# Patient Record
Sex: Male | Born: 1965 | Race: White | Hispanic: No | Marital: Single | Smoking: Never smoker
Health system: Southern US, Community
[De-identification: ages and names within clinical notes are randomized; demographics above are authoritative.]

## PROBLEM LIST (undated history)

## (undated) DIAGNOSIS — Z9889 Other specified postprocedural states: Secondary | ICD-10-CM

## (undated) DIAGNOSIS — I10 Essential (primary) hypertension: Secondary | ICD-10-CM

## (undated) DIAGNOSIS — T4145XA Adverse effect of unspecified anesthetic, initial encounter: Secondary | ICD-10-CM

## (undated) DIAGNOSIS — K649 Unspecified hemorrhoids: Secondary | ICD-10-CM

## (undated) DIAGNOSIS — Z87442 Personal history of urinary calculi: Secondary | ICD-10-CM

## (undated) DIAGNOSIS — Z8489 Family history of other specified conditions: Secondary | ICD-10-CM

## (undated) DIAGNOSIS — R112 Nausea with vomiting, unspecified: Secondary | ICD-10-CM

## (undated) HISTORY — PX: WISDOM TOOTH EXTRACTION: SHX21

## (undated) HISTORY — DX: Essential (primary) hypertension: I10

## (undated) HISTORY — PX: JOINT REPLACEMENT: SHX530

## (undated) HISTORY — PX: COLONOSCOPY: SHX174

---

## 1999-07-01 ENCOUNTER — Encounter: Payer: Self-pay | Admitting: Urology

## 1999-07-01 ENCOUNTER — Ambulatory Visit (HOSPITAL_COMMUNITY): Admission: RE | Admit: 1999-07-01 | Discharge: 1999-07-01 | Payer: Self-pay | Admitting: Urology

## 2000-09-22 HISTORY — PX: LITHOTRIPSY: SUR834

## 2001-04-08 ENCOUNTER — Emergency Department (HOSPITAL_COMMUNITY): Admission: EM | Admit: 2001-04-08 | Discharge: 2001-04-08 | Payer: Self-pay | Admitting: Emergency Medicine

## 2001-09-22 HISTORY — PX: OTHER SURGICAL HISTORY: SHX169

## 2001-12-14 ENCOUNTER — Encounter: Payer: Self-pay | Admitting: Internal Medicine

## 2001-12-14 ENCOUNTER — Ambulatory Visit (HOSPITAL_COMMUNITY): Admission: RE | Admit: 2001-12-14 | Discharge: 2001-12-14 | Payer: Self-pay | Admitting: Internal Medicine

## 2001-12-31 ENCOUNTER — Encounter: Payer: Self-pay | Admitting: Internal Medicine

## 2001-12-31 ENCOUNTER — Encounter: Admission: RE | Admit: 2001-12-31 | Discharge: 2001-12-31 | Payer: Self-pay | Admitting: Internal Medicine

## 2004-05-06 ENCOUNTER — Ambulatory Visit (HOSPITAL_COMMUNITY): Admission: RE | Admit: 2004-05-06 | Discharge: 2004-05-06 | Payer: Self-pay | Admitting: Gastroenterology

## 2005-09-22 HISTORY — PX: OTHER SURGICAL HISTORY: SHX169

## 2006-02-05 ENCOUNTER — Encounter (INDEPENDENT_AMBULATORY_CARE_PROVIDER_SITE_OTHER): Payer: Self-pay | Admitting: Specialist

## 2006-02-05 ENCOUNTER — Ambulatory Visit (HOSPITAL_BASED_OUTPATIENT_CLINIC_OR_DEPARTMENT_OTHER): Admission: RE | Admit: 2006-02-05 | Discharge: 2006-02-05 | Payer: Self-pay | Admitting: General Surgery

## 2006-09-22 DIAGNOSIS — T8859XA Other complications of anesthesia, initial encounter: Secondary | ICD-10-CM

## 2006-09-22 HISTORY — DX: Other complications of anesthesia, initial encounter: T88.59XA

## 2007-02-10 ENCOUNTER — Inpatient Hospital Stay (HOSPITAL_COMMUNITY): Admission: RE | Admit: 2007-02-10 | Discharge: 2007-02-12 | Payer: Self-pay | Admitting: Orthopedic Surgery

## 2007-04-14 ENCOUNTER — Encounter: Admission: RE | Admit: 2007-04-14 | Discharge: 2007-05-20 | Payer: Self-pay | Admitting: Orthopedic Surgery

## 2007-07-21 ENCOUNTER — Encounter: Admission: RE | Admit: 2007-07-21 | Discharge: 2007-10-19 | Payer: Self-pay | Admitting: Orthopedic Surgery

## 2007-10-11 ENCOUNTER — Inpatient Hospital Stay (HOSPITAL_COMMUNITY): Admission: RE | Admit: 2007-10-11 | Discharge: 2007-10-13 | Payer: Self-pay | Admitting: Orthopedic Surgery

## 2007-12-02 ENCOUNTER — Encounter: Admission: RE | Admit: 2007-12-02 | Discharge: 2008-03-01 | Payer: Self-pay | Admitting: Orthopedic Surgery

## 2008-01-12 ENCOUNTER — Ambulatory Visit (HOSPITAL_COMMUNITY): Admission: RE | Admit: 2008-01-12 | Discharge: 2008-01-12 | Payer: Self-pay | Admitting: Orthopedic Surgery

## 2008-05-04 ENCOUNTER — Emergency Department (HOSPITAL_COMMUNITY): Admission: EM | Admit: 2008-05-04 | Discharge: 2008-05-04 | Payer: Self-pay | Admitting: Emergency Medicine

## 2009-06-13 ENCOUNTER — Ambulatory Visit (HOSPITAL_COMMUNITY): Admission: RE | Admit: 2009-06-13 | Discharge: 2009-06-13 | Payer: Self-pay | Admitting: Orthopedic Surgery

## 2010-12-04 ENCOUNTER — Other Ambulatory Visit (HOSPITAL_COMMUNITY): Payer: Self-pay | Admitting: Orthopedic Surgery

## 2010-12-04 DIAGNOSIS — T84031A Mechanical loosening of internal left hip prosthetic joint, initial encounter: Secondary | ICD-10-CM

## 2010-12-06 ENCOUNTER — Inpatient Hospital Stay (HOSPITAL_COMMUNITY): Admission: RE | Admit: 2010-12-06 | Payer: Self-pay | Source: Ambulatory Visit

## 2010-12-17 ENCOUNTER — Other Ambulatory Visit: Payer: Self-pay | Admitting: Orthopedic Surgery

## 2010-12-17 DIAGNOSIS — M25552 Pain in left hip: Secondary | ICD-10-CM

## 2010-12-18 ENCOUNTER — Ambulatory Visit
Admission: RE | Admit: 2010-12-18 | Discharge: 2010-12-18 | Disposition: A | Discharge status: Home / Self Care | Payer: PRIVATE HEALTH INSURANCE | Source: Ambulatory Visit | Attending: Orthopedic Surgery | Admitting: Orthopedic Surgery

## 2010-12-18 DIAGNOSIS — M25552 Pain in left hip: Secondary | ICD-10-CM

## 2011-02-04 NOTE — Op Note (Signed)
Patrick Salas, BAHL NO.:  [id number]   MEDICAL RECORD NO.:  [id number]          PATIENT TYPE:  INP   LOCATION:  2899                         FACILITY:  [facility]   PHYSICIAN:  Feliberto Gottron. Turner Daniels, M.D.   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   PROCEDURE:  Complex right total hip arthroplasty using DePuy 52 mm ASR  cup, 20 x 15 x 165 x 42 S-ROM stem, 20B small cone, NK+0 46 mm Ultimate  head.   SURGEON:  Feliberto Gottron. Turner Daniels, M.D.   FIRST ASSISTANT:  Skip Mayer, [facility].   ANESTHETIC:  General endotracheal.   ESTIMATED BLOOD LOSS:  500 mL.   FLUID REPLACEMENT:  1500 mL crystalloid.   DRAINS PLACED:  Foley catheter.   URINE OUTPUT:  300 mL.   INDICATIONS FOR PROCEDURE:  A 45 year old man who developed avascular  necrosis of both hips and underwent free fibular grafting at [facility] about 5 years ago.  Both hips have gone on to collapse  despite the free fibular grafting.  He underwent a left total hip  arthroplasty by me in 2008, has done very well and desires same for the  right hip.  Risks and benefits of surgery are well-known to the patient,  questions answered.   DESCRIPTION OF PROCEDURE:  The patient identified by armband, taken to  the operating room at [facility].  Appropriate anesthetic  monitors were attached, general endotracheal anesthesia induced, and a  Foley catheter was inserted.  He was then rolled into the left lateral  decubitus position, fixed there with a Stulberg Mark II pelvic clamp,  and the right lower  extremity prepped and draped in the usual sterile  fashion from the ankle to the hemipelvis.  Skin along the lateral hip  and thigh infiltrated with 20 mL of 0.5% Marcaine and epinephrine  solution, and we began the procedure by making a posterolateral approach  to the hip centered over the greater trochanter, and the incision was  about 15 cm in length.  Small bleeders in the skin and subcutaneous  tissue were identified and cauterized.  The IT band was cut in line with  the skin incision, exposing the greater trochanter, which had a fair  amount of scar tissue sucked down to it from the old free fibular  grafting.  This was peeled posteriorly, exposing the posterior aspect of  the hip joint.  A Cobra retractor was placed between the gluteus minimus  and the superior hip joint capsule and a second Cobra between the  inferior hip joint capsule and the quadratus femoris.  This isolated the  piriformis and short external rotators which were then tagged  with a #2  Ethibond suture and cut off their insertion on the intertrochanteric  crest.  This exposed the hip joint capsule which was then developed into  an acetabular based flap and likewise tagged with two #2 Ethibond  sutures.  The femoral head was now well exposed and with internal  rotation, flexion and traction with abduction the head was dislocated  and a standard neck cut was performed 1 fingerbreadth above the lesser  trochanter.  The neck cut was a little bit difficult secondary to the  fibula that was existing inside of the neck of the femur, but this was  accomplished.  The proximal femur was then translated anteriorly,  levering off the anterior column with a Homan retractor, allowing [place] to  place posterior-superior and posterior-inferior para-acetabular wing  retractors.  the labrum was then excised with the electrocautery and we  sequentially reamed up to a 51 mm basket reamer, obtaining good coverage  in all quadrants, and  then lightly touched the edge with a 52 mm basket  reamer in preparation for the 52 mm ASR cup.  This was then hammered  into place in 45 degrees of abduction and 15 degrees of anteversion,  obtaining good fit and fill.  We then directed our attention to the  proximal femur which was flexed and internally rotated, and entered the  proximal femur with the very sharp step drill from DePuy, going right  through the free fibular graft, in the process obtaining access to the  femoral canal.  We then sounded the canal with the 8 mm reamer, making  sure to stay within the canal and not get deviated by the free fibular  graft which actually came out in pieces at this point.  We then  sequentially reamed up to 15 mm reamer, obtaining good chatter, reamed  three-fourths of the way down with a 15.5, and then conically reamed up  to 20B cone to the appropriate depth for a 42 mm neck.  The calcar was  then milled to a small calcar and a trial 20B small cone hammered into  place followed by a trial stem with an extra 10 degrees of anteversion  dialed into the stem with an NK+0 46 mm trial.  The hip was reduced,  came to full extension, and the knee could be flexed to 110 degrees  without any resistance.  It could flex to 120 degrees with no  dislocation and it took almost 80 degrees of internal rotation to become  unstable.  At this point the trial components were removed.  A 20B small  ZTT-1 cone was hammered into place followed by one more reamed with a  15.5 reamer, followed by the 20 x 15 x 165 x 42 stem in an extra 10  degrees of anteversion until it was seated, and then an NK+0 46 mm  Ultimate ball hammered onto the stem and the hip once again reduced.  Stability was excellent.  The wound was irrigated out with normal saline  solution.  The capsular flap and short external rotators were repaired  back to the intertrochanteric crest through drill holes using the #2  Ethibond suture.  At this point  the IT band was then closed with a  running #1 Vicryl suture, the subcutaneous tissue with 0 and 2-0 undyed  Vicryl suture, and the skin with running interlocking 3-0 nylon suture.  A dressing of Xeroform and Mepilex was then applied.  The patient was  unclamped, rolled supine,  awakened and taken to the recovery room  without difficulty.      Feliberto Gottron. Turner Daniels, M.D.  Electronically Signed     FJR/MEDQ  D:  10/11/2007  T:  10/11/2007  Job:  [id number]

## 2011-02-04 NOTE — Op Note (Signed)
NAMEMarland Kitchen  JAYKOB, MINICHIELLO NO.:  [id number]   MEDICAL RECORD NO.:  [id number]          PATIENT TYPE:  INP   LOCATION:  5036                         FACILITY:  [facility]   PHYSICIAN:  Feliberto Gottron. Turner Daniels, M.D.   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   PROCEDURE:  Left total hip arthroplasty through a previous surgery using  a DePuy 52-mm ASR cup, NK plus zero, 46 mm ultimate head, 20 x 15 x 42 x  175 stem, 20-B small cone.   FIRST ASSISTANT:  Erskine Squibb B. Jannet Mantis. first assist throughout the  case.   SURGEON:  Feliberto Gottron. Turner Daniels, M.D.   ANESTHESIA:  General endotracheal.   ESTIMATED BLOOD LOSS:  400 mL   FLUID REPLACEMENT:  1500 mL of crystalloid.   DRAINS PLACED:  Foley catheter.   TOURNIQUET TIME:  None.   INDICATIONS FOR PROCEDURE:  A 45 year old gentleman who has AVN of the  left hip and had a free fibular strut graft placed some years ago, I  believe, at [facility]. He has had collapse around  the strut graft; has severe pain in his left hip; and desires elective  left total hip arthroplasty.  He was seen by another physician here in  [place] who referred him on for consultation because of the fibular  strut placed up into the femoral neck which may interfere with a  standard total hip.  The plan is to do an ASR cup and an S-ROM which  will require no broaches, only milling.  Risks and benefits of surgery  were discussed, questions answered.   DESCRIPTION OF PROCEDURE:  The patient identified by armband, taken the  operating room at [facility].  Appropriate anesthetic monitors  were attached and general endotracheal anesthesia induced with the  patient in the supine position.  He was then rolled into the right  lateral decubitus position; fixed there with a silver Mark II pelvic  clamp; and the left lower extremity prepped and draped in the usual  sterile fashion from the ankle to the hemi pelvis.  The skin along the  lateral hip and thigh was infiltrated with 20 mL of 1/2% Marcaine and  epinephrine solution; and 18-cm incision allowing posterolateral  approach to the left hip was made through the skin and subcutaneous  tissue centered over the greater trochanter.   Small bleeders in the skin and subcutaneous tissue identified and  cauterized with electrocautery.  IT band was cut in line with the skin  incision exposing the greater trochanter.  Cobra retractors were placed  between the gluteus minimus and the superior joint capsule and between  the quadratus femoris; and the anterior hip  joint capsule.  At this  point the piriformis and short external rotators were tagged with a #2  Ethibond suture and cut off their insertion on the intertrochanteric  crest exposing the posterior aspect of the hip joint capsule, itself,  which was developed into an acetabular based flap and likewise tagged  with two #2 Ethibond sutures.  This exposed the femoral head which was  collapsed.  The hip was flexed, and internally rotated dislocating the  femoral head.  One fingerbreadth above the lesser trochanter, a standard  neck cut was performed which was made somewhat difficult by the free  fibular graft which was easily seen going up the femoral neck.  After  the head was extracted, it was examined and did have full thickness  collapse superiorly.   At this point the hip was placed in neutral rotation; the proximal femur  was translocated anteriorly using a Homan retractor to lever off the  anterior column  and posterior-superior and posterior-inferior wing  retractors were placed.  At this point a standard labrectomy was  performed removing the labrum from throughout the acetabulum which was  then sequentially reamed up to a 51 mm basket reamer obtaining good  coverage in all quadrants.  We just got into the subchondral bleeding  bone.   A 52 ASR cup was then selected and hammered into place and then 45  degrees of abduction and about 15 degrees of anteversion.  At this point  the hip was, once again,  flexed and internally rotated after removing  the wing retractors; and we were able to use rongeurs and actually  remove most of the fibular strut graft without too much difficulty.  Once that was accomplished, we entered the proximal femur with the step  drill from the DePuy S-ROM set; and then we actually reamed up to a 15.5-  mm axial reamer.  We milled up to a 20-B cone; and then milled the  calcar to a small calcar piece.  A trial 20-B small cone was then  hammered into place followed by a trial stem with a 42 base neck and the  same version as the calcar with a plus zero, 46 ultimate head.  The hip  was reduced and found to be stable with 90 of flexion and 70 of internal  rotation; and could not be dislocated in extension with external  rotation.   At this point the trial components removed a 20-B small ZTT-1 cone was  hammered into place, followed by a 20 x 15 x 42 175 stem in the same  version as the calcar.  Once this had been accomplished, an NK plus 0,  46 mm, ultimate head was hammered onto the stem; the hip reduced, and  the stability found to be excellent.  The wound was once again irrigated  out with normal saline solution; and then the short external rotators.  Capsule and piriformis were repaired back to the intertrochanteric crest  through drill holes using the #2 Ethibond suture.  The wound was irrigated one more time.  The IT band closed with a running #1 Vicryl   suture.  The subcutaneous tissue with #0 and #2 undyed Vicryl suture,  and the skin with running interlocking 3-0 nylon suture.  A dressing of  Xeroform, 4 x 4 dressing. Sponges, and Hypafix tape was then applied.  The patient was unclamped, rolled supine, awakened, and taken to the  recovery room without difficulty.      Feliberto Gottron.  Turner Daniels, M.D.  Electronically Signed     FJR/MEDQ  D:  03/01/2007  T:  03/02/2007  Job:  [id number]

## 2011-02-07 NOTE — Discharge Summary (Signed)
Patrick Salas, REASONER NO.:  [id number]   MEDICAL RECORD NO.:  [id number]          PATIENT TYPE:  INP   LOCATION:  5011                         FACILITY:  [facility]   PHYSICIAN:  Feliberto Gottron. Turner Daniels, M.D.   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   HISTORY:  The patient is a 45 year old man who developed a vascular  necrosis of the bilateral hips and underwent free fibular grafting at  [facility] approximately five years prior to this admission.  Both  hips have gone on to collapse, despite the free fibular grafting.  The  patient underwent a left total hip arthroplasty by Dr. Feliberto Gottron. Turner Daniels in  2008, and has done very well and desired the same for the right hip.  The risks and benefits of surgery were well known to the patient and his  questions were answered preoperatively.  He elected to proceed.   ALLERGIES:  STEROIDS.   MEDICATIONS ON ADMISSION:  1. Tylenol.  2. Lisinopril.   PAST MEDICAL HISTORY:  Hypertension.   PAST SURGICAL HISTORY:  1. For hemorrhoids.  2. Hip arthroscopy.  3. Free fibular graft in 2003.  4. Left total hip in 2008.  No difficulties with GT.   FAMILY HISTORY:  Mother is alive at age 93, with positive hypertension.  Father's history is not given.   SOCIAL HISTORY:  No tobacco, no ethanol, no drug use.  He is married.  He has a supportive wife at home.   REVIEW OF SYSTEMS:  The patient denies any shortness of breath, chest  pain or recent illness.   PHYSICAL EXAMINATION:  VITAL SIGNS:  Temperature 97.3 degrees, pulse 93,  blood pressure 139/96.  He is 5 feet 7 inches tall, weighs 180 pounds.  HEENT:  Normocephalic and atraumatic.  Ears:  Tympanic membranes clear.  Eyes:  Pupils equal, round, reactive to light and  accommodation.  Nose  is patent and benign.  NECK:  Supple with a fair range of motion.  CHEST:  Clear to auscultation and percussion.  HEART:  A regular rate and rhythm.  ABDOMEN:  Soft, nontender.  No masses.  Bowel sounds 2+.  EXTREMITIES:  Right hip range of motion:  Internal and external rotation  5 degrees and 10 degrees respectively.  Positive limp.  A well-healed  normal scar on the opposite hip.   LABORATORY DATA:  X-rays show AVN changes with positive collapse of the  femoral head.   Preoperative labs including a CBC, CMET, chest x-ray, electrocardiogram,  PT and PTT were all within normal limits with the exception of an ALT of  126.   HOSPITAL COURSE:  On the day of admission the patient underwent a total  hip arthroplasty using Dupuy 52 mm ASR cup and a 20 x 15 x 165 x 42 SROM  stem, 20-B small cone and NK +046 Ultima head.  A Foley catheter was  placed preoperatively.  The patient was placed on perioperative  antibiotics.  The patient was placed on postoperative Coumadin  prophylaxis per the pharmacy protocol and bridging Lovenox therapy,  until it became therapeutic.  He was placed on a PCA Dilaudid pump for  pain control and physical therapy was begun on the first postoperative  day.   On postoperative day one the patient was awake and alert using PCA well,  but complaining of severe pain at an 8/10.  His vital signs were stable.  The wound was benign.  He was neurovascularly intact.  X-rays showed a  well-placed prosthesis.  He was otherwise stable.  He continued with his  physical therapy.   On postoperative day two the patient was awake and alert.  His pain had  greatly diminished.  He had walked in the hallway but no stairs had yet  been attempted.  His vital signs were stable.  He was afebrile.  The  wound was clean and dry.  He was neurovascularly intact.  Hemoglobin  9.6, WBC 7.3.  Otherwise stable.  He was both medically improved and  orthopedically  improved as well.  He was discharged home after meeting  the physical therapy goals that afternoon.   DISCHARGE MEDICATIONS:  1. Lisinopril/hydrochlorothiazide 20/12.5 mg daily.  2. Vicodin p.r.n. pain.  3. Coumadin per pharmacy protocol.   ACTIVITY:  Weightbearing as tolerated with total hip precautions.   WOUND CARE:  Dressing changed daily or as needed.   DIET:  A regular diet.   FOLLOWUP:  The patient should return to the clinic in approximately one  week's time and for follow-up checks, should he have any drainage from  the wound, increase in pain or temperature greater than 101 degrees.  He  should follow up with [facility] for his PT, his wound care and  his Coumadin management per the pharmacy.   DISCHARGE DIAGNOSES:  End-stage degenerative joint disease, secondary to  acute vascular necrosis of the right hip.   PROCEDURE:  Right total hip arthroplasty.      Laural Benes. Jannet Mantis.      Feliberto Gottron. Turner Daniels, M.D.  Electronically Signed    JBR/MEDQ  D:  10/31/2007  T:  11/01/2007  Job:  [id number]

## 2011-06-12 LAB — URINALYSIS, ROUTINE W REFLEX MICROSCOPIC
Glucose, UA: NEGATIVE
Hgb urine dipstick: NEGATIVE
Ketones, ur: NEGATIVE
Protein, ur: NEGATIVE
Urobilinogen, UA: 0.2

## 2011-06-12 LAB — CBC
HCT: 44.7
HCT: 27.9 — ABNORMAL LOW
HCT: 29.1 — ABNORMAL LOW
Hemoglobin: 15.4
MCHC: 34.5
MCHC: 35
MCV: 82
MCV: 83.3
Platelets: 152
Platelets: 157
RBC: 5.46
RDW: 13.6
RDW: 13.9
WBC: 7.2

## 2011-06-12 LAB — DIFFERENTIAL
Basophils Absolute: 0.1
Eosinophils Absolute: 0.1
Eosinophils Relative: 2
Lymphocytes Relative: 27
Lymphs Abs: 1.9
Neutrophils Relative %: 66

## 2011-06-12 LAB — COMPREHENSIVE METABOLIC PANEL
AST: 22
CO2: 28
Calcium: 9.7
Chloride: 99
Creatinine, Ser: 1.33
GFR calc Af Amer: >60
GFR calc non Af Amer: 60 — ABNORMAL LOW
Glucose, Bld: 101 — ABNORMAL HIGH
Total Bilirubin: 1.1

## 2011-06-12 LAB — BASIC METABOLIC PANEL
BUN: 12
BUN: 16
CO2: 28
Calcium: 8.2 — ABNORMAL LOW
Calcium: 8.2 — ABNORMAL LOW
Creatinine, Ser: 1.32
Creatinine, Ser: 1.33
GFR calc non Af Amer: 60 — ABNORMAL LOW
Glucose, Bld: 108 — ABNORMAL HIGH
Glucose, Bld: 114 — ABNORMAL HIGH
Potassium: 3.9

## 2011-06-12 LAB — PROTIME-INR
INR: 0.9
Prothrombin Time: 12.2
Prothrombin Time: 13.6
Prothrombin Time: 21.6 — ABNORMAL HIGH

## 2011-06-12 LAB — TYPE AND SCREEN: ABO/RH(D): O NEG

## 2011-12-10 ENCOUNTER — Other Ambulatory Visit (HOSPITAL_COMMUNITY): Payer: Self-pay | Admitting: Orthopedic Surgery

## 2011-12-10 DIAGNOSIS — M25551 Pain in right hip: Secondary | ICD-10-CM

## 2011-12-12 ENCOUNTER — Ambulatory Visit (HOSPITAL_COMMUNITY)
Admission: RE | Admit: 2011-12-12 | Discharge: 2011-12-12 | Disposition: A | Discharge status: Home / Self Care | Payer: Self-pay | Source: Ambulatory Visit | Attending: Orthopedic Surgery | Admitting: Orthopedic Surgery

## 2011-12-12 DIAGNOSIS — M25551 Pain in right hip: Secondary | ICD-10-CM

## 2012-12-13 ENCOUNTER — Ambulatory Visit (INDEPENDENT_AMBULATORY_CARE_PROVIDER_SITE_OTHER): Payer: Medicare Other | Admitting: General Surgery

## 2012-12-13 ENCOUNTER — Encounter (INDEPENDENT_AMBULATORY_CARE_PROVIDER_SITE_OTHER): Payer: Self-pay | Admitting: General Surgery

## 2012-12-13 ENCOUNTER — Telehealth (INDEPENDENT_AMBULATORY_CARE_PROVIDER_SITE_OTHER): Payer: Self-pay

## 2012-12-13 VITALS — BP 142/98 | HR 100 | Temp 97.7°F | Resp 16 | Ht 67.0 in | Wt 205.8 lb

## 2012-12-13 DIAGNOSIS — K648 Other hemorrhoids: Secondary | ICD-10-CM

## 2012-12-13 DIAGNOSIS — K644 Residual hemorrhoidal skin tags: Secondary | ICD-10-CM | POA: Insufficient documentation

## 2012-12-13 MED ORDER — HYDROCORTISONE 2.5 % RE CREA: TOPICAL_CREAM | Freq: Two times a day (BID) | RECTAL | Status: DC

## 2012-12-13 NOTE — Patient Instructions (Signed)
Take over the counter stool softener twice daily.  Use prescription cream right at outside of anus on extra tissue that you can feel\  Avoid lots of wiping and trauma.  Consider using water squirt bottle and blow dryer to avoid additional trauma.

## 2012-12-13 NOTE — Assessment & Plan Note (Signed)
Patient has internal hemorrhoids and external hemorrhoids. External hemorrhoid does appear to be slightly pedunculated and may not respond to topical therapies. He also has significant excoriations in the perianal region. This is likely contributing to his sensation of itching and burning. I injected the internal hemorrhoid. We will try Anusol HC cream externally. If this is ineffective we will see what other therapies we can try. He may end up having to have the external hemorrhoid excised.  I reviewed good bowel habits with the patient.  I advised him to add over the counter stool softeners and to avoid additional trauma to the region.

## 2012-12-13 NOTE — Progress Notes (Signed)
Chief Complaint  Patient presents with  . New Evaluation    eval hems  . Rectal Problems    HISTORY: Patient is a 47 year old male has had hemorrhoids for around 3 months. Has been complaints are itching and burning. He also describes some bleeding. He was seen by Dr. Earlene Plater in the past for an anal fissure. The symptoms are not the same as that.  He already drinks a fair amount of water. He eats a lot of vegetables. He does not somewhat toilet for a long time. He also does not complain of straining to have bowel movements. He has bowel movements daily. He has tried over-the-counter suppositories from [employer].  Past Medical History  Diagnosis Date  . Hypertension     Past Surgical History  Procedure Laterality Date  . Joint replacement Bilateral 2008/2009  . Vascular fibular graft  2003    Current Outpatient Prescriptions  Medication Sig Dispense Refill  . amLODipine (NORVASC) 10 MG tablet       . losartan (COZAAR) 100 MG tablet       . hydrocortisone (ANUSOL-HC) 2.5 % rectal cream Place rectally 2 (two) times daily.  30 g  3   No current facility-administered medications for this visit.     Allergies  Allergen Reactions  . Other     DON NOT GIVE PATIENT STEROIDS     History reviewed. No pertinent family history.   History   Social History  . Marital Status: Single    Spouse Name: N/A    Number of Children: N/A  . Years of Education: N/A   Social History Main Topics  . Smoking status: Never Smoker   . Smokeless tobacco: None  . Alcohol Use: No  . Drug Use: No  . Sexually Active: None   Other Topics Concern  . None   Social History Narrative  . None     REVIEW OF SYSTEMS - PERTINENT POSITIVES ONLY: 12 point review of systems negative other than HPI and PMH EXAM: Filed Vitals:   12/13/12 1013  BP: 142/98  Pulse: 100  Temp: 97.7 F (36.5 C)  Resp: 16    Gen:  No acute distress.  Well nourished and well groomed.   Neurological: Alert and  oriented to person, place, and time. Coordination normal.  Head: Normocephalic and atraumatic.  Eyes: Conjunctivae are normal. Pupils are equal, round, and reactive to light. No scleral icterus.  Cardiovascular: Normal rate, regular rhythm Respiratory: Effort normal.  No respiratory distress.  GI: Soft. Musculoskeletal: Normal range of motion. Extremities are nontender. normal gait.   Rectal:  External exam reveals moderate pedunculated hemorrhoid on posterior left aspect.  There are numerous areas of excoriation around the anus and along the perineum. Anoscopy reveals a large posterior inflamed internal hemorrhoid.  This is injected.   Skin: Skin is warm and dry. No rash noted. No diaphoresis. No erythema. No pallor. No clubbing, cyanosis, or edema.   Psychiatric: Normal mood and affect. Behavior is normal. Judgment and thought content normal.     ASSESSMENT AND PLAN: Internal and external bleeding hemorrhoids Patient has internal hemorrhoids and external hemorrhoids. External hemorrhoid does appear to be slightly pedunculated and may not respond to topical therapies. He also has significant excoriations in the perianal region. This is likely contributing to his sensation of itching and burning. I injected the internal hemorrhoid. We will try Anusol HC cream externally. If this is ineffective we will see what other therapies we can try. He may end  up having to have the external hemorrhoid excised.  I reviewed good bowel habits with the patient.  I advised him to add over the counter stool softeners and to avoid additional trauma to the region.       Maudry Diego MD Surgical Oncology, General and Endocrine Surgery [facility]      Visit Diagnoses: 1. Internal and external bleeding hemorrhoids     Primary Care Physician: Lillia Mountain, MD

## 2012-12-13 NOTE — Telephone Encounter (Signed)
Rx for generic Anusol HC w/ 3 refills called to [facility] [address]  Pt is aware.  Pt cannot take oral steroids so is somewhat concerned about the concentration of steroids in this cream.  The pharmacist and Dr. Donell Beers both reassured him that the amount is so small and the medicine is topical not oral, so there is no risk to him.

## 2012-12-20 ENCOUNTER — Ambulatory Visit (INDEPENDENT_AMBULATORY_CARE_PROVIDER_SITE_OTHER): Payer: Self-pay | Admitting: Surgery

## 2013-01-25 ENCOUNTER — Ambulatory Visit (INDEPENDENT_AMBULATORY_CARE_PROVIDER_SITE_OTHER): Payer: Medicare Other | Admitting: General Surgery

## 2013-01-25 ENCOUNTER — Encounter (INDEPENDENT_AMBULATORY_CARE_PROVIDER_SITE_OTHER): Payer: Self-pay | Admitting: General Surgery

## 2013-01-25 VITALS — BP 136/86 | HR 88 | Temp 97.7°F | Ht 67.0 in | Wt 205.8 lb

## 2013-01-25 DIAGNOSIS — K648 Other hemorrhoids: Secondary | ICD-10-CM

## 2013-01-25 DIAGNOSIS — K644 Residual hemorrhoidal skin tags: Secondary | ICD-10-CM

## 2013-01-25 NOTE — Progress Notes (Signed)
HISTORY: Patient's 47 year old male muscle proximally 6 weeks ago for hemorrhoids. He is doing around 25-30% better. He has still a little bit of bleeding when he wipes. The burning and itching has improved significantly.  He continues to feel something swollen around his bottom. He has been using stool softeners and has had less constipation than before.   PERTINENT REVIEW OF SYSTEMS: Otherwise negative.    Filed Vitals:   01/25/13 1156  BP: 136/86  Pulse: 88  Temp: 97.7 F (36.5 C)    EXAM: Head: Normocephalic and atraumatic.  Eyes:  Conjunctivae are normal. Pupils are equal, round, and reactive to light. No scleral icterus.  Resp: No respiratory distress, normal effort. Neurological: Alert and oriented to person, place, and time. Coordination normal.  Skin: Skin is warm and dry. No rash noted. No diaphoretic. No erythema. No pallor.  Rectal.  Continues to have irritation of perianal skin.  Some areas are cracked.  External hemorrhoid posteriorly less inflamed, but still pedunculated.   Psychiatric: Normal mood and affect. Normal behavior. Judgment and thought content normal.      ASSESSMENT AND PLAN:   Internal and external bleeding hemorrhoids Patient continues to have excoriations around his anus. I would not want to surgically treat the hemorrhoid until these have been improved and/or resolved.  I advised him to apply Desitin to the perianal skin.  I will see him back in approximately 6 weeks. At that time if he is still having issues, we will schedule an in office excision of the pedunculated posterior external hemorrhoid.      Maudry Diego, MD Surgical Oncology, General & Endocrine Surgery [facility]  Lillia Mountain, MD Lillia Mountain, MD

## 2013-01-25 NOTE — Patient Instructions (Signed)
Apply desitin to anal region 1-3 times/day.    Follow up in 6-8 weeks.

## 2013-01-25 NOTE — Assessment & Plan Note (Signed)
Patient continues to have excoriations around his anus. I would not want to surgically treat the hemorrhoid until these have been improved and/or resolved.  I advised him to apply Desitin to the perianal skin.  I will see him back in approximately 6 weeks. At that time if he is still having issues, we will schedule an in office excision of the pedunculated posterior external hemorrhoid.

## 2013-03-01 ENCOUNTER — Encounter (INDEPENDENT_AMBULATORY_CARE_PROVIDER_SITE_OTHER): Payer: Self-pay | Admitting: General Surgery

## 2013-03-01 ENCOUNTER — Ambulatory Visit (INDEPENDENT_AMBULATORY_CARE_PROVIDER_SITE_OTHER): Payer: Medicare Other | Admitting: General Surgery

## 2013-03-01 VITALS — BP 142/100 | HR 113 | Temp 97.4°F | Resp 16 | Ht 67.0 in | Wt 204.4 lb

## 2013-03-01 DIAGNOSIS — K644 Residual hemorrhoidal skin tags: Secondary | ICD-10-CM

## 2013-03-01 DIAGNOSIS — B372 Candidiasis of skin and nail: Secondary | ICD-10-CM | POA: Insufficient documentation

## 2013-03-01 DIAGNOSIS — K648 Other hemorrhoids: Secondary | ICD-10-CM

## 2013-03-01 MED ORDER — NYSTATIN 100000 UNIT/GM EX CREA: TOPICAL_CREAM | Freq: Two times a day (BID) | CUTANEOUS | Status: DC

## 2013-03-01 NOTE — Assessment & Plan Note (Signed)
Nystatin cream to perianal area.  D/C all other creams.

## 2013-03-01 NOTE — Progress Notes (Signed)
HISTORY: Patient is a 47 year old male who i have been following for hemorrhoids.  He continues to have itching and burning around the anus.  He is only having rare instances of bleeding.  He is not spending a long period of time on the toilet.  He has been using baby wipes to avoid trauma to the perineum.  He has been using desitin on the perianal skin.     PERTINENT REVIEW OF SYSTEMS: Otherwise negative.    Filed Vitals:   03/01/13 1202  BP: 142/100  Pulse: 113  Temp: 97.4 F (36.3 C)  Resp: 16    EXAM: Head: Normocephalic and atraumatic.  Eyes:  Conjunctivae are normal. Pupils are equal, round, and reactive to light. No scleral icterus.  Resp: No respiratory distress, normal effort. Neurological: Alert and oriented to person, place, and time. Coordination normal.  Skin: Skin is warm and dry. No rash noted. No diaphoretic. No erythema. No pallor.  Rectal.  Has worsening of irritation of perianal skin.  Some areas are cracked.  Now there is bright red color to perianal skin.  External hemorrhoid posteriorly remains less inflamed, but still pedunculated.   Psychiatric: Normal mood and affect. Normal behavior. Judgment and thought content normal.      ASSESSMENT AND PLAN:   Yeast infection of the perianal skin Nystatin cream to perianal area.  D/C all other creams.  Internal and external bleeding hemorrhoids Will plan EUA and hemorrhoidectomy  Pt going to talk to scheduling about financial information.         Maudry Diego, MD Surgical Oncology, General & Endocrine Surgery [facility]  Lillia Mountain, MD Lillia Mountain, MD

## 2013-03-01 NOTE — Patient Instructions (Addendum)
Will plan exam under anesthesia and hemorrhoidectomy.  In the meantime, only use the antifungal cream around anus.   Will have you talk to scheduling regarding financial information.

## 2013-03-01 NOTE — Assessment & Plan Note (Signed)
Will plan EUA and hemorrhoidectomy  Pt going to talk to scheduling about financial information.

## 2013-11-03 ENCOUNTER — Other Ambulatory Visit: Payer: Self-pay | Admitting: Orthopedic Surgery

## 2013-11-08 ENCOUNTER — Encounter (HOSPITAL_COMMUNITY): Payer: Self-pay | Admitting: Pharmacy Technician

## 2013-11-09 ENCOUNTER — Encounter (HOSPITAL_COMMUNITY): Payer: Self-pay

## 2013-11-09 ENCOUNTER — Encounter (HOSPITAL_COMMUNITY)
Admission: RE | Admit: 2013-11-09 | Discharge: 2013-11-09 | Disposition: A | Discharge status: Home / Self Care | Payer: Medicare Other | Source: Ambulatory Visit | Attending: Orthopedic Surgery | Admitting: Orthopedic Surgery

## 2013-11-09 ENCOUNTER — Encounter (HOSPITAL_COMMUNITY)
Admission: RE | Admit: 2013-11-09 | Discharge: 2013-11-09 | Disposition: A | Discharge status: Home / Self Care | Payer: Medicare Other | Source: Ambulatory Visit | Attending: Anesthesiology | Admitting: Anesthesiology

## 2013-11-09 DIAGNOSIS — Z01810 Encounter for preprocedural cardiovascular examination: Secondary | ICD-10-CM | POA: Insufficient documentation

## 2013-11-09 DIAGNOSIS — Z01812 Encounter for preprocedural laboratory examination: Secondary | ICD-10-CM | POA: Insufficient documentation

## 2013-11-09 DIAGNOSIS — Z01818 Encounter for other preprocedural examination: Secondary | ICD-10-CM | POA: Insufficient documentation

## 2013-11-09 HISTORY — DX: Other specified postprocedural states: Z98.890

## 2013-11-09 HISTORY — DX: Personal history of urinary calculi: Z87.442

## 2013-11-09 HISTORY — DX: Adverse effect of unspecified anesthetic, initial encounter: T41.45XA

## 2013-11-09 HISTORY — DX: Nausea with vomiting, unspecified: R11.2

## 2013-11-09 HISTORY — DX: Unspecified hemorrhoids: K64.9

## 2013-11-09 HISTORY — DX: Family history of other specified conditions: Z84.89

## 2013-11-09 LAB — TYPE AND SCREEN
ABO/RH(D): O NEG
Antibody Screen: NEGATIVE

## 2013-11-09 LAB — CBC WITH DIFFERENTIAL/PLATELET
BASOS ABS: 0 10*3/uL (ref 0.0–0.1)
Basophils Relative: 0 % (ref 0–1)
Eosinophils Absolute: 0.3 10*3/uL (ref 0.0–0.7)
Eosinophils Relative: 2 % (ref 0–5)
HCT: 41.1 % (ref 39.0–52.0)
Hemoglobin: 14.4 g/dL (ref 13.0–17.0)
Lymphocytes Relative: 26 % (ref 12–46)
Lymphs Abs: 2.7 10*3/uL (ref 0.7–4.0)
MCH: 28.9 pg (ref 26.0–34.0)
MCHC: 35 g/dL (ref 30.0–36.0)
MCV: 82.5 fL (ref 78.0–100.0)
Monocytes Absolute: 0.5 10*3/uL (ref 0.1–1.0)
Monocytes Relative: 5 % (ref 3–12)
Neutro Abs: 7.1 10*3/uL (ref 1.7–7.7)
Neutrophils Relative %: 67 % (ref 43–77)
PLATELETS: 241 10*3/uL (ref 150–400)
RBC: 4.98 MIL/uL (ref 4.22–5.81)
RDW: 13.3 % (ref 11.5–15.5)
WBC: 10.6 10*3/uL — AB (ref 4.0–10.5)

## 2013-11-09 LAB — SURGICAL PCR SCREEN
MRSA, PCR: NEGATIVE
STAPHYLOCOCCUS AUREUS: POSITIVE — AB

## 2013-11-09 LAB — BASIC METABOLIC PANEL
BUN: 18 mg/dL (ref 6–23)
CALCIUM: 9.9 mg/dL (ref 8.4–10.5)
CO2: 21 meq/L (ref 19–32)
CREATININE: 1.48 mg/dL — AB (ref 0.50–1.35)
Chloride: 99 mEq/L (ref 96–112)
GFR calc non Af Amer: 56 mL/min — ABNORMAL LOW (ref >90)
GFR, EST AFRICAN AMERICAN: 64 mL/min — AB (ref >90)
Glucose, Bld: 98 mg/dL (ref 70–99)
Potassium: 3.7 mEq/L (ref 3.7–5.3)
Sodium: 138 mEq/L (ref 137–147)

## 2013-11-09 LAB — URINALYSIS, ROUTINE W REFLEX MICROSCOPIC
Bilirubin Urine: NEGATIVE
GLUCOSE, UA: NEGATIVE mg/dL
HGB URINE DIPSTICK: NEGATIVE
Ketones, ur: NEGATIVE mg/dL
Leukocytes, UA: NEGATIVE
Nitrite: NEGATIVE
PH: 5 (ref 5.0–8.0)
Protein, ur: NEGATIVE mg/dL
SPECIFIC GRAVITY, URINE: 1.02 (ref 1.005–1.030)
Urobilinogen, UA: 0.2 mg/dL (ref 0.0–1.0)

## 2013-11-09 LAB — PROTIME-INR
INR: 0.93 (ref 0.00–1.49)
PROTHROMBIN TIME: 12.3 s (ref 11.6–15.2)

## 2013-11-09 LAB — APTT: aPTT: 27 seconds (ref 24–37)

## 2013-11-09 NOTE — Pre-Procedure Instructions (Signed)
Patrick Salas  11/09/2013   Your procedure is scheduled on:  Wednesday, February 25.   Report to [facility], Main Entrance / Entrance "A" at 10:45 AM.  Call this number if you have problems the morning of surgery: [phone]   Remember:   Do not eat food or drink liquids after midnight Tuesday, February 24.   Take these medicines the morning of surgery with A SIP OF WATER: amLODipine (NORVASC).     Do not wear jewelry, make-up or nail polish.  Do not wear lotions, powders, or perfumes. You may wear deodorant.  Do not shave 48 hours prior to surgery. Men may shave face and neck.  Do not bring valuables to the hospital.  [facility] is not responsible for any belongings or valuables.               Contacts, dentures or bridgework may not be worn into surgery.  Leave suitcase in the car. After surgery it may be brought to your room.  For patients admitted to the hospital, discharge time is determined by your treatment team.                 Special Instructions:  Review [facility]- Preparing for Surgery   Please read over the following fact sheets that you were given: Pain Booklet, Coughing and Deep Breathing, Blood Transfusion Information and Surgical Site Infection Prevention

## 2013-11-09 NOTE — Progress Notes (Signed)
PCR postive for Staph.  I called [facility] to [facility], [address] , [place], [place].

## 2013-11-09 NOTE — Progress Notes (Signed)
Anesthesia Chart Review:  Patient is a 48 year old male scheduled for left total hip revision on 11/16/13 by Dr. Turner Danielsowan. History includes non-smoker, HTN, nephrolithiasis, family and personal history of post-operative N/V, bilateral THA, hemorrhoids. BMI is 32. History indicates that he "woke up" and saw lights for "maybe 2 seconds" during surgery in 2008. PCP is listed as Dr. Kirby FunkJohn Griffin.  EKG on 11/09/13 showed NSR, non-specific T wave abnormality.  CXR on 11/09/13 showed evidence of prior granulomatous disease (stable calcified granuloma left base).  No edema or consolidation.  Preoperative labs noted.  BUN/Cr 18/1.48 (previous Cr 1.33 in 2009).  Glucose 98.  WBC 10.6, H/H 14.4/41.1. PT/PTT WNL.  UA WNL.  Anticipate patient can proceed as planned.  Velna Ochsllison Zelenak, PA-C [facility]/Anesthesiology Phone [phone] 11/09/2013 3:42 PM

## 2013-11-15 MED ORDER — DEXTROSE-NACL 5-0.45 % IV SOLN: INTRAVENOUS | Status: DC

## 2013-11-15 MED ORDER — CEFAZOLIN SODIUM-DEXTROSE 2-3 GM-% IV SOLR
2.0000 g | INTRAVENOUS | Status: AC
  Administered 2013-11-16: 2 g via INTRAVENOUS
  Filled 2013-11-15: qty 50

## 2013-11-15 MED ORDER — CHLORHEXIDINE GLUCONATE 4 % EX LIQD
60.0000 mL | Freq: Once | CUTANEOUS | Status: DC
  Filled 2013-11-15: qty 60

## 2013-11-15 NOTE — H&P (Signed)
TOTAL HIP REVISION ADMISSION H&P  Patient is admitted for left revision total hip arthroplasty.  Subjective:  Chief Complaint: left hip pain  HPI: Patrick Salas, 48 y.o. male, has a history of pain and functional disability in the left hip due to arthritis and patient has failed non-surgical conservative treatments for greater than 12 weeks to include NSAID's and/or analgesics, flexibility and strengthening excercises and activity modification. The indications for the revision total hip arthroplasty are bearing surface wear leading to  symptomatic synovitis.  Onset of symptoms was gradual starting 2 years ago with gradually worsening course since that time.  Prior procedures on the left hip include arthroplasty.  Patient currently rates pain in the left hip at 10 out of 10 with activity.  There is night pain, worsening of pain with activity and weight bearing, pain that interfers with activities of daily living and pain with passive range of motion.   This condition presents safety issues increasing the risk of falls.  This patient has had painful ASR hip.  There is no current active infection.  Patient Active Problem List   Diagnosis Date Noted  . Yeast infection of the perianal skin 03/01/2013  . Internal and external bleeding hemorrhoids 12/13/2012   Past Medical History  Diagnosis Date  . Hypertension   . Complication of anesthesia 2008    woke up during surgery- saw lights , did not hear anything.."maybe 2 sceonds".  Marland Kitchen. PONV (postoperative nausea and vomiting)   . Family history of anesthesia complication     "whole family nausea"  . History of kidney stones   . Hemorrhoids     Past Surgical History  Procedure Laterality Date  . Joint replacement Bilateral 2008/2009    2008- left, 2009- right  . Vascular fibular graft Bilateral 2003  . Lithotripsy    . Wisdom tooth extraction      No prescriptions prior to admission   Allergies  Allergen Reactions  . Other Other (See  Comments)    Do not give patient any steroids  . Prednisone Other (See Comments)    Lost blood flow to legs    History  Substance Use Topics  . Smoking status: Never Smoker   . Smokeless tobacco: Not on file  . Alcohol Use: No    No family history on file.    Review of Systems  Constitutional: Negative.   HENT: Negative.   Eyes: Negative.   Respiratory: Negative.   Cardiovascular: Negative.   Gastrointestinal: Negative.   Musculoskeletal: Positive for joint pain.  Skin: Negative.   Neurological: Negative.   Endo/Heme/Allergies: Negative.   Psychiatric/Behavioral: Negative.     Objective:  Physical Exam  Constitutional: He is oriented to person, place, and time. He appears well-developed and well-nourished.  HENT:  Head: Normocephalic and atraumatic.  Eyes: Pupils are equal, round, and reactive to light.  Neck: Normal range of motion. Neck supple.  Cardiovascular: Normal rate and regular rhythm.   Respiratory: Effort normal.  Musculoskeletal:  Surgical scars to both hips are well-healed, there is no sign of infection and no erythema  Neurological: He is alert and oriented to person, place, and time. He has normal reflexes.  Skin: Skin is warm and dry.  Psychiatric: He has a normal mood and affect. His behavior is normal. Judgment and thought content normal.    Vital signs in last 24 hours:     Labs:   Estimated body mass index is 32.01 kg/(m^2) as calculated from the following:  Height as of 03/01/13: 5\' 7"  (1.702 m).   Weight as of 03/01/13: 92.715 kg (204 lb 6.4 oz).  Imaging Review:  X-rays from March of 2014 are reviewed and show well-placed well fixed prosthesis with no overt evidence of loosening.  Assessment/Plan:  End stage arthritis, left hip(s) with failed previous arthroplasty.  The patient history, physical examination, clinical judgement of the provider and imaging studies are consistent with end stage degenerative joint disease of the left  hip(s), previous total hip arthroplasty. Revision total hip arthroplasty is deemed medically necessary. The treatment options including medical management, injection therapy, arthroscopy and arthroplasty were discussed at length. The risks and benefits of total hip arthroplasty were presented and reviewed. The risks due to aseptic loosening, infection, stiffness, dislocation/subluxation,  thromboembolic complications and other imponderables were discussed.  The patient acknowledged the explanation, agreed to proceed with the plan and consent was signed. Patient is being admitted for inpatient treatment for surgery, pain control, PT, OT, prophylactic antibiotics, VTE prophylaxis, progressive ambulation and ADL's and discharge planning. The patient is planning to be discharged home with home health services.  Risks and benefits of revision surgery are once again discussed with the patient. He would like to proceed with revision. We will convert him to a titanium shell polyethylene liner and on the femoral side.  A ceramic head implant.  I will see him back at the time of surgical intervention.

## 2013-11-16 ENCOUNTER — Encounter (HOSPITAL_COMMUNITY)
Admission: RE | Disposition: A | Discharge status: Home Health | Payer: Self-pay | Source: Ambulatory Visit | Attending: Orthopedic Surgery

## 2013-11-16 ENCOUNTER — Encounter (HOSPITAL_COMMUNITY): Payer: Medicare Other | Admitting: Vascular Surgery

## 2013-11-16 ENCOUNTER — Inpatient Hospital Stay (HOSPITAL_COMMUNITY)
Admission: RE | Admit: 2013-11-16 | Discharge: 2013-11-18 | DRG: 468 | Disposition: A | Discharge status: Home Health | Payer: Medicare Other | Source: Ambulatory Visit | Attending: Orthopedic Surgery | Admitting: Orthopedic Surgery

## 2013-11-16 ENCOUNTER — Encounter (HOSPITAL_COMMUNITY): Payer: Self-pay | Admitting: *Deleted

## 2013-11-16 ENCOUNTER — Inpatient Hospital Stay (HOSPITAL_COMMUNITY): Payer: Medicare Other | Admitting: Anesthesiology

## 2013-11-16 ENCOUNTER — Inpatient Hospital Stay (HOSPITAL_COMMUNITY): Payer: Medicare Other

## 2013-11-16 DIAGNOSIS — Z79899 Other long term (current) drug therapy: Secondary | ICD-10-CM

## 2013-11-16 DIAGNOSIS — Y831 Surgical operation with implant of artificial internal device as the cause of abnormal reaction of the patient, or of later complication, without mention of misadventure at the time of the procedure: Secondary | ICD-10-CM | POA: Diagnosis present

## 2013-11-16 DIAGNOSIS — Z7982 Long term (current) use of aspirin: Secondary | ICD-10-CM

## 2013-11-16 DIAGNOSIS — I10 Essential (primary) hypertension: Secondary | ICD-10-CM | POA: Diagnosis present

## 2013-11-16 DIAGNOSIS — T84059A Periprosthetic osteolysis of unspecified internal prosthetic joint, initial encounter: Secondary | ICD-10-CM | POA: Diagnosis present

## 2013-11-16 DIAGNOSIS — Z96649 Presence of unspecified artificial hip joint: Secondary | ICD-10-CM

## 2013-11-16 DIAGNOSIS — T84039A Mechanical loosening of unspecified internal prosthetic joint, initial encounter: Principal | ICD-10-CM | POA: Diagnosis present

## 2013-11-16 DIAGNOSIS — T84018A Broken internal joint prosthesis, other site, initial encounter: Secondary | ICD-10-CM

## 2013-11-16 HISTORY — PX: TOTAL HIP REVISION: SHX763

## 2013-11-16 SURGERY — TOTAL HIP REVISION
Anesthesia: General | Laterality: Left

## 2013-11-16 MED ORDER — ARTIFICIAL TEARS OP OINT
TOPICAL_OINTMENT | OPHTHALMIC | Status: DC | PRN
  Administered 2013-11-16: 1 via OPHTHALMIC

## 2013-11-16 MED ORDER — LACTATED RINGERS IV SOLN
INTRAVENOUS | Status: DC
  Administered 2013-11-16: 11:00:00 via INTRAVENOUS

## 2013-11-16 MED ORDER — PHENYLEPHRINE HCL 10 MG/ML IJ SOLN
INTRAMUSCULAR | Status: AC
  Filled 2013-11-16: qty 1

## 2013-11-16 MED ORDER — ONDANSETRON HCL 4 MG/2ML IJ SOLN
INTRAMUSCULAR | Status: AC
  Filled 2013-11-16: qty 2

## 2013-11-16 MED ORDER — METHOCARBAMOL 500 MG PO TABS: 500.0000 mg | ORAL_TABLET | Freq: Two times a day (BID) | ORAL | Status: DC

## 2013-11-16 MED ORDER — OXYCODONE HCL 5 MG PO TABS: 5.0000 mg | ORAL_TABLET | ORAL | Status: DC | PRN

## 2013-11-16 MED ORDER — FENTANYL CITRATE 0.05 MG/ML IJ SOLN
INTRAMUSCULAR | Status: AC
  Filled 2013-11-16: qty 5

## 2013-11-16 MED ORDER — ACETAMINOPHEN 650 MG RE SUPP: 650.0000 mg | Freq: Four times a day (QID) | RECTAL | Status: DC | PRN

## 2013-11-16 MED ORDER — ROCURONIUM BROMIDE 50 MG/5ML IV SOLN
INTRAVENOUS | Status: AC
  Filled 2013-11-16: qty 1

## 2013-11-16 MED ORDER — ASPIRIN EC 325 MG PO TBEC
325.0000 mg | DELAYED_RELEASE_TABLET | Freq: Every day | ORAL | Status: DC
  Administered 2013-11-17 – 2013-11-18 (×2): 325 mg via ORAL
  Filled 2013-11-16 (×3): qty 1

## 2013-11-16 MED ORDER — FENTANYL CITRATE 0.05 MG/ML IJ SOLN
INTRAMUSCULAR | Status: DC | PRN
  Administered 2013-11-16 (×6): 50 ug via INTRAVENOUS

## 2013-11-16 MED ORDER — OXYCODONE HCL 5 MG PO TABS: 5.0000 mg | ORAL_TABLET | Freq: Once | ORAL | Status: DC | PRN

## 2013-11-16 MED ORDER — ACETAMINOPHEN 325 MG PO TABS: 650.0000 mg | ORAL_TABLET | Freq: Four times a day (QID) | ORAL | Status: DC | PRN

## 2013-11-16 MED ORDER — ONDANSETRON HCL 4 MG/2ML IJ SOLN: 4.0000 mg | Freq: Once | INTRAMUSCULAR | Status: DC | PRN

## 2013-11-16 MED ORDER — OXYCODONE HCL 5 MG/5ML PO SOLN: 5.0000 mg | Freq: Once | ORAL | Status: DC | PRN

## 2013-11-16 MED ORDER — ALBUMIN HUMAN 5 % IV SOLN
INTRAVENOUS | Status: DC | PRN
  Administered 2013-11-16: 14:00:00 via INTRAVENOUS

## 2013-11-16 MED ORDER — ROCURONIUM BROMIDE 100 MG/10ML IV SOLN
INTRAVENOUS | Status: DC | PRN
  Administered 2013-11-16: 50 mg via INTRAVENOUS

## 2013-11-16 MED ORDER — DOCUSATE SODIUM 100 MG PO CAPS
100.0000 mg | ORAL_CAPSULE | Freq: Two times a day (BID) | ORAL | Status: DC
  Administered 2013-11-16 – 2013-11-18 (×4): 100 mg via ORAL
  Filled 2013-11-16 (×5): qty 1

## 2013-11-16 MED ORDER — ONDANSETRON HCL 4 MG/2ML IJ SOLN
INTRAMUSCULAR | Status: DC | PRN
  Administered 2013-11-16: 4 mg via INTRAVENOUS

## 2013-11-16 MED ORDER — METHOCARBAMOL 100 MG/ML IJ SOLN
500.0000 mg | Freq: Four times a day (QID) | INTRAVENOUS | Status: DC | PRN
  Filled 2013-11-16: qty 5

## 2013-11-16 MED ORDER — PHENYLEPHRINE HCL 10 MG/ML IJ SOLN
INTRAMUSCULAR | Status: DC | PRN
  Administered 2013-11-16 (×3): 40 ug via INTRAVENOUS

## 2013-11-16 MED ORDER — ONDANSETRON HCL 4 MG PO TABS: 4.0000 mg | ORAL_TABLET | Freq: Four times a day (QID) | ORAL | Status: DC | PRN

## 2013-11-16 MED ORDER — LIDOCAINE HCL (CARDIAC) 20 MG/ML IV SOLN
INTRAVENOUS | Status: DC | PRN
  Administered 2013-11-16: 40 mg via INTRAVENOUS

## 2013-11-16 MED ORDER — DIPHENHYDRAMINE HCL 12.5 MG/5ML PO ELIX: 12.5000 mg | ORAL_SOLUTION | ORAL | Status: DC | PRN

## 2013-11-16 MED ORDER — ONDANSETRON HCL 4 MG/2ML IJ SOLN
4.0000 mg | Freq: Once | INTRAMUSCULAR | Status: AC
  Administered 2013-11-16: 4 mg via INTRAVENOUS
  Filled 2013-11-16: qty 2

## 2013-11-16 MED ORDER — HYDROMORPHONE HCL PF 1 MG/ML IJ SOLN
0.2500 mg | INTRAMUSCULAR | Status: DC | PRN
  Administered 2013-11-16 (×2): 0.5 mg via INTRAVENOUS

## 2013-11-16 MED ORDER — EPHEDRINE SULFATE 50 MG/ML IJ SOLN
INTRAMUSCULAR | Status: DC | PRN
  Administered 2013-11-16: 50 mg via INTRAVENOUS

## 2013-11-16 MED ORDER — ASPIRIN EC 325 MG PO TBEC: 325.0000 mg | DELAYED_RELEASE_TABLET | Freq: Two times a day (BID) | ORAL | Status: DC

## 2013-11-16 MED ORDER — LIDOCAINE HCL (CARDIAC) 20 MG/ML IV SOLN
INTRAVENOUS | Status: AC
  Filled 2013-11-16: qty 5

## 2013-11-16 MED ORDER — MIDAZOLAM HCL 5 MG/5ML IJ SOLN
INTRAMUSCULAR | Status: DC | PRN
  Administered 2013-11-16 (×2): 1 mg via INTRAVENOUS

## 2013-11-16 MED ORDER — MENTHOL 3 MG MT LOZG: 1.0000 | LOZENGE | OROMUCOSAL | Status: DC | PRN

## 2013-11-16 MED ORDER — AMLODIPINE BESYLATE 10 MG PO TABS
10.0000 mg | ORAL_TABLET | Freq: Every day | ORAL | Status: DC
  Administered 2013-11-17 – 2013-11-18 (×2): 10 mg via ORAL
  Filled 2013-11-16 (×2): qty 1

## 2013-11-16 MED ORDER — ONDANSETRON HCL 4 MG/2ML IJ SOLN: 4.0000 mg | Freq: Four times a day (QID) | INTRAMUSCULAR | Status: DC | PRN

## 2013-11-16 MED ORDER — HYDROMORPHONE HCL PF 1 MG/ML IJ SOLN
1.0000 mg | INTRAMUSCULAR | Status: DC | PRN
  Administered 2013-11-16 – 2013-11-17 (×4): 1 mg via INTRAVENOUS
  Filled 2013-11-16 (×4): qty 1

## 2013-11-16 MED ORDER — PHENYLEPHRINE 40 MCG/ML (10ML) SYRINGE FOR IV PUSH (FOR BLOOD PRESSURE SUPPORT)
PREFILLED_SYRINGE | INTRAVENOUS | Status: AC
  Filled 2013-11-16: qty 10

## 2013-11-16 MED ORDER — METHOCARBAMOL 500 MG PO TABS
500.0000 mg | ORAL_TABLET | Freq: Four times a day (QID) | ORAL | Status: DC | PRN
  Administered 2013-11-16 – 2013-11-17 (×3): 500 mg via ORAL
  Filled 2013-11-16 (×3): qty 1

## 2013-11-16 MED ORDER — METOCLOPRAMIDE HCL 5 MG/ML IJ SOLN: 5.0000 mg | Freq: Three times a day (TID) | INTRAMUSCULAR | Status: DC | PRN

## 2013-11-16 MED ORDER — PHENOL 1.4 % MT LIQD: 1.0000 | OROMUCOSAL | Status: DC | PRN

## 2013-11-16 MED ORDER — NEOSTIGMINE METHYLSULFATE 1 MG/ML IJ SOLN
INTRAMUSCULAR | Status: DC | PRN
  Administered 2013-11-16: 4 mg via INTRAVENOUS

## 2013-11-16 MED ORDER — SENNOSIDES-DOCUSATE SODIUM 8.6-50 MG PO TABS: 1.0000 | ORAL_TABLET | Freq: Every evening | ORAL | Status: DC | PRN

## 2013-11-16 MED ORDER — HYDROMORPHONE HCL PF 1 MG/ML IJ SOLN
INTRAMUSCULAR | Status: AC
  Administered 2013-11-16: 0.5 mg via INTRAVENOUS
  Filled 2013-11-16: qty 1

## 2013-11-16 MED ORDER — KCL IN DEXTROSE-NACL 20-5-0.45 MEQ/L-%-% IV SOLN
INTRAVENOUS | Status: DC
  Administered 2013-11-16 – 2013-11-17 (×2): 125 mL/h via INTRAVENOUS
  Administered 2013-11-17: 13:00:00 via INTRAVENOUS
  Filled 2013-11-16 (×8): qty 1000

## 2013-11-16 MED ORDER — TRANEXAMIC ACID 100 MG/ML IV SOLN
1000.0000 mg | INTRAVENOUS | Status: AC
  Administered 2013-11-16: 1000 mg via INTRAVENOUS
  Filled 2013-11-16: qty 10

## 2013-11-16 MED ORDER — MAGNESIUM CITRATE PO SOLN: 1.0000 | Freq: Once | ORAL | Status: AC | PRN

## 2013-11-16 MED ORDER — 0.9 % SODIUM CHLORIDE (POUR BTL) OPTIME
TOPICAL | Status: DC | PRN
  Administered 2013-11-16: 1000 mL

## 2013-11-16 MED ORDER — BUPIVACAINE-EPINEPHRINE 0.5% -1:200000 IJ SOLN
INTRAMUSCULAR | Status: DC | PRN
  Administered 2013-11-16: 10 mL

## 2013-11-16 MED ORDER — BISACODYL 5 MG PO TBEC: 5.0000 mg | DELAYED_RELEASE_TABLET | Freq: Every day | ORAL | Status: DC | PRN

## 2013-11-16 MED ORDER — PROPOFOL 10 MG/ML IV BOLUS
INTRAVENOUS | Status: DC | PRN
  Administered 2013-11-16: 170 mg via INTRAVENOUS

## 2013-11-16 MED ORDER — OXYCODONE-ACETAMINOPHEN 5-325 MG PO TABS: 1.0000 | ORAL_TABLET | ORAL | Status: DC | PRN

## 2013-11-16 MED ORDER — BUPIVACAINE-EPINEPHRINE (PF) 0.5% -1:200000 IJ SOLN
INTRAMUSCULAR | Status: AC
  Filled 2013-11-16: qty 10

## 2013-11-16 MED ORDER — LOSARTAN POTASSIUM 50 MG PO TABS
100.0000 mg | ORAL_TABLET | Freq: Every day | ORAL | Status: DC
  Administered 2013-11-17 – 2013-11-18 (×2): 100 mg via ORAL
  Filled 2013-11-16 (×5): qty 2

## 2013-11-16 MED ORDER — NEOSTIGMINE METHYLSULFATE 1 MG/ML IJ SOLN
INTRAMUSCULAR | Status: AC
  Filled 2013-11-16: qty 10

## 2013-11-16 MED ORDER — ARTIFICIAL TEARS OP OINT
TOPICAL_OINTMENT | OPHTHALMIC | Status: AC
  Filled 2013-11-16: qty 3.5

## 2013-11-16 MED ORDER — SODIUM CHLORIDE 0.9 % IV SOLN
10.0000 mg | INTRAVENOUS | Status: DC | PRN
  Administered 2013-11-16: 20 ug/min via INTRAVENOUS

## 2013-11-16 MED ORDER — METOCLOPRAMIDE HCL 10 MG PO TABS: 5.0000 mg | ORAL_TABLET | Freq: Three times a day (TID) | ORAL | Status: DC | PRN

## 2013-11-16 MED ORDER — LACTATED RINGERS IV SOLN
INTRAVENOUS | Status: DC | PRN
  Administered 2013-11-16 (×2): via INTRAVENOUS

## 2013-11-16 MED ORDER — PROPOFOL 10 MG/ML IV BOLUS
INTRAVENOUS | Status: AC
  Filled 2013-11-16: qty 20

## 2013-11-16 MED ORDER — GLYCOPYRROLATE 0.2 MG/ML IJ SOLN
INTRAMUSCULAR | Status: AC
  Filled 2013-11-16: qty 3

## 2013-11-16 MED ORDER — GLYCOPYRROLATE 0.2 MG/ML IJ SOLN
INTRAMUSCULAR | Status: DC | PRN
  Administered 2013-11-16: .7 mg via INTRAVENOUS

## 2013-11-16 SURGICAL SUPPLY — 70 items
BOWL SMART MIX CTS (DISPOSABLE) IMPLANT
BRUSH FEMORAL CANAL (MISCELLANEOUS) IMPLANT
CLOTH BEACON ORANGE TIMEOUT ST (SAFETY) ×2 IMPLANT
CONT SPEC 4OZ CLIKSEAL STRL BL (MISCELLANEOUS) ×1 IMPLANT
COVER SURGICAL LIGHT HANDLE (MISCELLANEOUS) ×2 IMPLANT
CUP PINN GRIPTON 54 100 (Cup) ×1 IMPLANT
DRAPE C-ARM 42X72 X-RAY (DRAPES) IMPLANT
DRAPE ORTHO SPLIT 77X108 STRL (DRAPES) ×2
DRAPE PROXIMA HALF (DRAPES) ×3 IMPLANT
DRAPE SURG ORHT 6 SPLT 77X108 (DRAPES) ×1 IMPLANT
DRAPE U-SHAPE 47X51 STRL (DRAPES) ×2 IMPLANT
DRILL BIT 7/64X5 (BIT) ×1 IMPLANT
DRSG MEPILEX BORDER 4X12 (GAUZE/BANDAGES/DRESSINGS) ×1 IMPLANT
DRSG MEPILEX BORDER 4X8 (GAUZE/BANDAGES/DRESSINGS) ×2 IMPLANT
DURAPREP 26ML APPLICATOR (WOUND CARE) ×3 IMPLANT
ELECT BLADE 4.0 EZ CLEAN MEGAD (MISCELLANEOUS) ×2
ELECT BLADE 6.5 EXT (BLADE) IMPLANT
ELECT REM PT RETURN 9FT ADLT (ELECTROSURGICAL) ×2
ELECTRODE BLDE 4.0 EZ CLN MEGD (MISCELLANEOUS) IMPLANT
ELECTRODE REM PT RTRN 9FT ADLT (ELECTROSURGICAL) ×1 IMPLANT
ELIMINATOR HOLE APEX DEPUY (Hips) ×1 IMPLANT
EVACUATOR 1/8 PVC DRAIN (DRAIN) IMPLANT
GAUZE XEROFORM 5X9 LF (GAUZE/BANDAGES/DRESSINGS) ×2 IMPLANT
GLOVE BIO SURGEON STRL SZ7.5 (GLOVE) ×2 IMPLANT
GLOVE BIO SURGEON STRL SZ8.5 (GLOVE) ×4 IMPLANT
GLOVE BIOGEL PI IND STRL 8 (GLOVE) ×2 IMPLANT
GLOVE BIOGEL PI IND STRL 9 (GLOVE) ×1 IMPLANT
GLOVE BIOGEL PI INDICATOR 8 (GLOVE) ×2
GLOVE BIOGEL PI INDICATOR 9 (GLOVE) ×1
GOWN PREVENTION PLUS XLARGE (GOWN DISPOSABLE) ×6 IMPLANT
GOWN STRL NON-REIN LRG LVL3 (GOWN DISPOSABLE) ×4 IMPLANT
GOWN STRL REIN XL XLG (GOWN DISPOSABLE) ×4 IMPLANT
HANDPIECE INTERPULSE COAX TIP (DISPOSABLE)
HEAD FEM DLT TS CER 36X+0 (Hips) ×1 IMPLANT
HEEL PROTECTOR  874200 (MISCELLANEOUS)
HEEL PROTECTOR 874200 (MISCELLANEOUS) IMPLANT
HOOD PEEL AWAY FACE SHEILD DIS (HOOD) ×4 IMPLANT
KIT BASIN OR (CUSTOM PROCEDURE TRAY) ×2 IMPLANT
KIT ROOM TURNOVER OR (KITS) ×2 IMPLANT
LINER MARATHON 10D 36X54 (Hips) IMPLANT
LINER MARATHON 10DEG 36X54 (Hips) ×2 IMPLANT
MANIFOLD NEPTUNE II (INSTRUMENTS) ×2 IMPLANT
NEEDLE 22X1 1/2 (OR ONLY) (NEEDLE) ×3 IMPLANT
NOZZLE PRISM 8.5MM (MISCELLANEOUS) IMPLANT
NS IRRIG 1000ML POUR BTL (IV SOLUTION) ×3 IMPLANT
PACK TOTAL JOINT (CUSTOM PROCEDURE TRAY) ×2 IMPLANT
PAD ARMBOARD 7.5X6 YLW CONV (MISCELLANEOUS) ×4 IMPLANT
PASSER SUT SWANSON 36MM LOOP (INSTRUMENTS) IMPLANT
PRESSURIZER FEMORAL UNIV (MISCELLANEOUS) IMPLANT
SET HNDPC FAN SPRY TIP SCT (DISPOSABLE) IMPLANT
SLEEVE SURGEON STRL (DRAPES) IMPLANT
SPONGE GAUZE 4X4 12PLY (GAUZE/BANDAGES/DRESSINGS) ×2 IMPLANT
SPONGE LAP 18X18 X RAY DECT (DISPOSABLE) ×1 IMPLANT
STAPLER VISISTAT 35W (STAPLE) ×2 IMPLANT
STEM FEM MOD STD 42 15X20X165 (Hips) ×1 IMPLANT
SUT ETHIBOND 2 V 37 (SUTURE) ×2 IMPLANT
SUT ETHILON 3 0 FSL (SUTURE) ×2 IMPLANT
SUT VIC AB 0 CTB1 27 (SUTURE) ×2 IMPLANT
SUT VIC AB 1 CTX 36 (SUTURE) ×2
SUT VIC AB 1 CTX36XBRD ANBCTR (SUTURE) ×1 IMPLANT
SUT VIC AB 2-0 CTB1 (SUTURE) ×2 IMPLANT
SWAB COLLECTION DEVICE MRSA (MISCELLANEOUS) ×1 IMPLANT
SYR 20ML ECCENTRIC (SYRINGE) ×2 IMPLANT
SYR CONTROL 10ML LL (SYRINGE) ×3 IMPLANT
TOWEL OR 17X24 6PK STRL BLUE (TOWEL DISPOSABLE) ×2 IMPLANT
TOWEL OR 17X26 10 PK STRL BLUE (TOWEL DISPOSABLE) ×2 IMPLANT
TOWER CARTRIDGE SMART MIX (DISPOSABLE) IMPLANT
TRAY FOLEY CATH 14FR (SET/KITS/TRAYS/PACK) IMPLANT
TUBE ANAEROBIC SPECIMEN COL (MISCELLANEOUS) ×2 IMPLANT
WATER STERILE IRR 1000ML POUR (IV SOLUTION) ×3 IMPLANT

## 2013-11-16 NOTE — Op Note (Addendum)
Preop diagnosis: Painful ASR on SROM L total hip  Postoperative diagnosis: Same, the DePuy ASR cup was actually loose with fibrous ingrowth only, there is also some mild osteolysis of the pubic ramus  Procedure: Revision left total hip arthroplasty with removal of ASR cup and femoral head and revision to a 54 mm Gryption cup 10 polyethylene liner index posterior and superior and a +0 36 mm ceramic head. We also revise to the stem component placing a new 20 x 15 x 1 65 x 42 stem inside of the sleeve that was retained. Synovial fluid was sent for Gram stain and culture but did not appear to be infected  Surgeon: Feliberto Gottron. Turner Daniels M.D.  Assistant: Tomi Likens. Gaylene Brooks  (present throughout entire procedure and necessary for timely completion of the procedure)  Estimated blood loss: 400 cc cc  Fluid replacement: 1500 cc cc of crystalloid  Complications: None  Indications: Patient with an ASR on S-ROM total hip that did very well until a 6-9 months ago when he had increasing groin pain. The pain wakes him up at night and recently got to the point where he could no longer go walking on a regular basis. MRI scan showed no fluid collection, no bony destruction. Plain x-rays show no change in the position of the components and the stem appears to be well ingrown. Risks and benefits of revision surgery have been discussed and questions answered.  Procedure: Patient was identified by arm band receive preoperative IV antibiotics in the holding area at, and hospital. He was then taken to the operating room where the appropriate anesthetic monitors were attached and general endotracheal anesthesia induced with the patient in the supine position. He was then rolled into the R lateral decubitus position and fixed there with a mark 2 pelvic clamp. A Foley catheter was inserted and the limb prepped and draped in usual sterile fashion from the ankle to the hemipelvis. Time out procedure performed. Skin along the  lateral hip and thigh infiltrated with 10 cc of 1/2% Marcaine and epinephrine solution. We began the operation by recreating the old posterior lateral incision 18 cm in line through the skin and subcutaneous tissue down to the level of the IT band which was cut in line with the skin incision. This exposed the greater trochanter which had a normal appearance. We entered the pseudocapsule, and obtained some cherry-colored clear fluid was sent off for Gram stain and culture. The pseudocapsule was peeled back posteriorly and tagged with 2 #2 Ethibond sutures allowing [place] to remove superior and inferior scar tissue from around the ASR trunnion and femoral head. We dislocated the total hip and removed the ASR head with a mallet and metal cylinder. The hip was then flexed and internally rotated and the seal between the stem and the sleeve was broken with a large flat screwdriver and the stem removed without too much difficulty. This allowed [place] to translate the proximal femur anteriorly with a Hohmann retractor.We began loosening the cup by placing a 1/4 inch osteotome between the edge of the acetabular component and the bone. We then used the short Innomed curved osteotome around the edge of the cup and at that point it came out relatively easily indicating no ingrowth. Nobone was noticed on the ingrowth surface of the cup and fibrous tissue is then stripped from the acetabulum. There is mild ostial lysis at the junction of the pubis in the acetabulum but did not require bone grafting. We reamed up to a  53 mm basket reamer obtaining good coverage in all quadrants irrigated with normal saline solution. We then hammered into place a 54 mm DePuy Gryption cup in 45 of abduction and 20 of anteversion. A central occluder was screwed into place followed by a 10 polyethylene liner index posterior and superior. A trial reduction was then performed with a +0 36 mm femoral head, on a trial stem, instability was noted to 90 of  flexion 60 of internal rotation and in full extension the hip could not be dislocated with external rotation. The trial stem was removed and a new 20 x 15 x 165 x 42 stem was hammered into place in the same version as the sleeve. At this point a real +0 36 mm ceramic head was hammered into place, the hip reduced and irrigated with normal saline solution. The capsular flap was repaired back to the intertrochanteric crest through drill holes with #2 Ethibond suture. We then closed the IT band with running #1 Vicryl suture, the subcutaneous tissue with 0 and 2-0 undyed Vicryl suture, the skin was closed with running interlocking 3-0 nylon suture. A dressing of Aquacil was then applied, the patient was unclamped a rolled supine awakened extubated and taken to the recovery without difficulty.

## 2013-11-16 NOTE — Anesthesia Preprocedure Evaluation (Signed)
Anesthesia Evaluation  Patient identified by MRN, date of birth, ID band Patient awake    Reviewed: Allergy & Precautions, H&P , NPO status , Patient's Chart, lab work & pertinent test results  Airway Mallampati: II TM Distance: >3 FB Neck ROM: Full    Dental  (+) Teeth Intact, Dental Advisory Given   Pulmonary  breath sounds clear to auscultation        Cardiovascular hypertension, Rhythm:Regular Rate:Normal     Neuro/Psych    GI/Hepatic   Endo/Other    Renal/GU      Musculoskeletal   Abdominal   Peds  Hematology   Anesthesia Other Findings   Reproductive/Obstetrics                           Anesthesia Physical Anesthesia Plan  ASA: II  Anesthesia Plan: General   Post-op Pain Management:    Induction: Intravenous  Airway Management Planned: Oral ETT  Additional Equipment:   Intra-op Plan:   Post-operative Plan: Extubation in OR  Informed Consent: I have reviewed the patients History and Physical, chart, labs and discussed the procedure including the risks, benefits and alternatives for the proposed anesthesia with the patient or authorized representative who has indicated his/her understanding and acceptance.   Dental advisory given  Plan Discussed with: CRNA and Anesthesiologist  Anesthesia Plan Comments: (S/P L. THR, needs revision Htn H/O Post-op N/V  Plan GA with oral ETT  Kipp Broodavid Kalven Ganim, MD)        Anesthesia Quick Evaluation

## 2013-11-16 NOTE — Anesthesia Procedure Notes (Signed)
Procedure Name: Intubation Date/Time: 11/16/2013 1:28 PM Performed by: Fransisca KaufmannMEYER, Barrie Sigmund E Pre-anesthesia Checklist: Patient identified, Emergency Drugs available, Suction available, Patient being monitored and Timeout performed Patient Re-evaluated:Patient Re-evaluated prior to inductionOxygen Delivery Method: Circle system utilized Preoxygenation: Pre-oxygenation with 100% oxygen Intubation Type: IV induction Ventilation: Mask ventilation without difficulty Laryngoscope Size: Miller and 3 Grade View: Grade II Tube type: Oral Tube size: 7.5 mm Number of attempts: 1 Airway Equipment and Method: Stylet Placement Confirmation: ETT inserted through vocal cords under direct vision,  positive ETCO2 and breath sounds checked- equal and bilateral Secured at: 23 cm Tube secured with: Tape Dental Injury: Teeth and Oropharynx as per pre-operative assessment

## 2013-11-16 NOTE — Progress Notes (Signed)
Orthopedic Tech Progress Note Patient Details:  Patrick Salas [birth year] [id number]  Ortho Devices Ortho Device/Splint Location: put ohf on bed Ortho Device/Splint Interventions: Ordered;Application   Jennye MoccasinHughes, [name] 11/16/2013, 6:12 PM

## 2013-11-16 NOTE — Preoperative (Signed)
Beta Blockers   Reason not to administer Beta Blockers:Not Applicable 

## 2013-11-16 NOTE — Interval H&P Note (Signed)
History and Physical Interval Note:  11/16/2013 12:34 PM  Patrick Salas  has presented today for surgery, with the diagnosis of PAINFUL LEFT TOTAL HIP ARTHROPLASTY ASR  The various methods of treatment have been discussed with the patient and family. After consideration of risks, benefits and other options for treatment, the patient has consented to  Procedure(s): TOTAL HIP REVISION (Left) as a surgical intervention .  The patient's history has been reviewed, patient examined, no change in status, stable for surgery.  I have reviewed the patient's chart and labs.  Questions were answered to the patient's satisfaction.     Nestor LewandowskyOWAN,Quavon Keisling J

## 2013-11-16 NOTE — Transfer of Care (Signed)
Immediate Anesthesia Transfer of Care Note  Patient: Patrick Salas  Procedure(s) Performed: Procedure(s): TOTAL HIP REVISION (Left)  Patient Location: PACU  Anesthesia Type:General  Level of Consciousness: awake and alert   Airway & Oxygen Therapy: Patient Spontanous Breathing and Patient connected to face mask oxygen  Post-op Assessment: Report given to PACU RN and Post -op Vital signs reviewed and stable  Post vital signs: Reviewed and stable  Complications: No apparent anesthesia complications

## 2013-11-17 LAB — CBC
HCT: 29.4 % — ABNORMAL LOW (ref 39.0–52.0)
Hemoglobin: 10 g/dL — ABNORMAL LOW (ref 13.0–17.0)
MCH: 28.2 pg (ref 26.0–34.0)
MCHC: 34 g/dL (ref 30.0–36.0)
MCV: 83.1 fL (ref 78.0–100.0)
Platelets: 171 10*3/uL (ref 150–400)
RBC: 3.54 MIL/uL — ABNORMAL LOW (ref 4.22–5.81)
RDW: 13.2 % (ref 11.5–15.5)
WBC: 8.1 10*3/uL (ref 4.0–10.5)

## 2013-11-17 LAB — BASIC METABOLIC PANEL
BUN: 18 mg/dL (ref 6–23)
CALCIUM: 8.5 mg/dL (ref 8.4–10.5)
CHLORIDE: 100 meq/L (ref 96–112)
CO2: 25 mEq/L (ref 19–32)
CREATININE: 1.52 mg/dL — AB (ref 0.50–1.35)
GFR calc non Af Amer: 53 mL/min — ABNORMAL LOW (ref >90)
GFR, EST AFRICAN AMERICAN: 62 mL/min — AB (ref >90)
Glucose, Bld: 109 mg/dL — ABNORMAL HIGH (ref 70–99)
Potassium: 4.1 mEq/L (ref 3.7–5.3)
Sodium: 137 mEq/L (ref 137–147)

## 2013-11-17 MED ORDER — HYDROCODONE-ACETAMINOPHEN 5-325 MG PO TABS
1.0000 | ORAL_TABLET | ORAL | Status: DC | PRN
  Administered 2013-11-17 (×2): 1 via ORAL
  Administered 2013-11-18 (×2): 2 via ORAL
  Filled 2013-11-17: qty 1
  Filled 2013-11-17 (×3): qty 2

## 2013-11-17 NOTE — Care Management Note (Signed)
CARE MANAGEMENT NOTE 11/17/2013  Patient:  Patrick HanBUCHANAN,Patrick D   Account Number:  [id number]  Date Initiated:  11/17/2013  Documentation initiated by:  Vance PeperBRADY,Clem Wisenbaker  Subjective/Objective Assessment:   48 yr old male s/p left total hip revision     Action/Plan:   Case manager spoke with patient concerning home health and DME needs at discharge, Choice offered. Patient thinks [facility] used [facility], CM called Marlin CanaryMary Y, [facility] liasion to see if the can take patient. He has rolling walker and 3in1.   Anticipated DC Date:  11/18/2013   Anticipated DC Plan:  HOME W HOME HEALTH SERVICES      DC Planning Services  CM consult      [facility]  [facility]ALTH  DURABLE MEDICAL EQUIPMENT   Choice offered to / List presented to:  C-1 Patient        HH arranged  HH-2 PT      Status of service:  In process, will continue to follow

## 2013-11-17 NOTE — Anesthesia Postprocedure Evaluation (Signed)
  Anesthesia Post-op Note  Patient: Patrick Salas  Procedure(s) Performed: Procedure(s): TOTAL HIP REVISION (Left)  Patient Location: Nursing Unit  Anesthesia Type:General  Level of Consciousness: awake, alert , oriented and patient cooperative  Airway and Oxygen Therapy: Patient Spontanous Breathing  Post-op Pain: mild  Post-op Assessment: Post-op Vital signs reviewed, Patient's Cardiovascular Status Stable, Respiratory Function Stable, Patent Airway, No signs of Nausea or vomiting, Adequate PO intake, Pain level controlled, No headache, No backache, No residual numbness and No residual motor weakness  Post-op Vital Signs: Reviewed and stable  Complications: No apparent anesthesia complications

## 2013-11-17 NOTE — Progress Notes (Signed)
Physical Therapy Treatment Patient Details Name: Patrick Salas MRN: [id number] DOB: 06/22/1966 Today's Date: 11/17/2013 Time: [id number] PT Time Calculation (min): 8 min  PT Assessment / Plan / Recommendation  History of Present Illness admitted for elective revision THR   PT Comments   Patient in bed and deferred out of bed.  Patient did participate in exercises while in bed.  Feel patient will be ready for d/c tomorrow after stair training.   Follow Up Recommendations  Home health PT;Outpatient PT     Does the patient have the potential to tolerate intense rehabilitation     Barriers to Discharge        Equipment Recommendations  None recommended by PT    Recommendations for Other Services    Frequency 7X/week   Progress towards PT Goals Progress towards PT goals: Progressing toward goals  Plan Current plan remains appropriate    Precautions / Restrictions Precautions Precautions: Posterior Hip   Pertinent Vitals/Pain Patient with some pain left hip during exercises.  Repositioned.         Exercises Total Joint Exercises Ankle Circles/Pumps: AROM;Left;10 reps;Supine Quad Sets: AROM;Left;10 reps;Supine Short Arc Quad: AROM;Left;10 reps;Supine Heel Slides: Limitations Heel Slides Limitations: unble due to pain Hip ABduction/ADduction: AAROM;Left;10 reps;Supine     Visit Information  Last PT Received On: 11/17/13 Assistance Needed: +1 History of Present Illness: admitted for elective revision THR       Cognition  Cognition Arousal/Alertness: Awake/alert Behavior During Therapy: WFL for tasks assessed/performed Overall Cognitive Status: Within Functional Limits for tasks assessed    Balance  Balance Overall balance assessment: No apparent balance deficits (not formally assessed)  End of Session PT - End of Session Activity Tolerance: Patient tolerated treatment well Patient left: in bed;with call bell/phone within reach   GP     Olivia CanterMoton, Avid Guillette M,  [place] [phone] 11/17/2013, 2:40 PM

## 2013-11-17 NOTE — Progress Notes (Signed)
Utilization review completed. Geovannie Vilar, RN, BSN. 

## 2013-11-17 NOTE — Progress Notes (Signed)
Patient ID: Patrick Salas, male   DOB: October 08, 1965, 48 y.o.   MRN: [id number] PATIENT ID: Patrick Salas  MRN: [id number]  DOB/AGE:  October 08, 1965 / 48 y.o.  1 Day Post-Op Procedure(s) (LRB): TOTAL HIP REVISION (Left)    PROGRESS NOTE Subjective: Patient is alert, oriented,no Nausea, no Vomiting, yes passing gas, no Bowel Movement. Taking PO well. Denies SOB, Chest or Calf Pain. Using Incentive Spirometer, PAS in place. Ambulate WBAT Patient reports pain as 3 on 0-10 scale  .    Objective: Vital signs in last 24 hours: Filed Vitals:   11/17/13 0000 11/17/13 0400 11/17/13 0517 11/17/13 1106  BP:   132/89 132/89  Pulse:   90   Temp:   97.9 F (36.6 C)   TempSrc:      Resp: 16 18 18    Height:      Weight:      SpO2: 100% 100% 98%       Intake/Output from previous day: I/O last 3 completed shifts: In: 1950 [I.V.:1700; IV Piggyback:250] Out: 1275 [Urine:975; Blood:300]   Intake/Output this shift: Total I/O In: 240 [P.O.:240] Out: -    LABORATORY DATA:  Recent Labs  11/17/13 0452  WBC 8.1  HGB 10.0*  HCT 29.4*  PLT 171  NA 137  K 4.1  CL 100  CO2 25  BUN 18  CREATININE 1.52*  GLUCOSE 109*  CALCIUM 8.5    Examination: Neurologically intact ABD soft Neurovascular intact Sensation intact distally Intact pulses distally Dorsiflexion/Plantar flexion intact Incision: moderate drainage No cellulitis present Compartment soft} XR AP&Lat of hip shows well placed\fixed THA  Assessment:   1 Day Post-Op Procedure(s) (LRB): TOTAL HIP REVISION (Left) ADDITIONAL DIAGNOSIS:    Plan: PT/OT WBAT, THA  posterior precautions  DVT Prophylaxis: SCDx72 hrs, ASA 325 mg BID x 2 weeks  DISCHARGE PLAN: Home  DISCHARGE NEEDS: HHPT, HHRN, CPM, Walker and 3-in-1 comode seat

## 2013-11-17 NOTE — Evaluation (Signed)
Physical Therapy Evaluation Patient Details Name: Patrick Salas MRN: [id number] DOB: [birth year] Today's Date: 11/17/2013 Time: [id number] PT Time Calculation (min): 16 min  PT Assessment / Plan / Recommendation History of Present Illness  admitted for elective revision THR  Clinical Impression  Patient doing well with mobility and should be ready for d/c tomorrow as planned.  Will instruct in stairs before leaving.    PT Assessment  Patient needs continued PT services    Follow Up Recommendations  Home health PT;Outpatient PT    Does the patient have the potential to tolerate intense rehabilitation      Barriers to Discharge        Equipment Recommendations  None recommended by PT    Recommendations for Other Services     Frequency 7X/week    Precautions / Restrictions Precautions Precautions: Posterior Hip   Pertinent Vitals/Pain Denies pain at this time      Mobility  Bed Mobility General bed mobility comments: patient in bathroom upon arrival Transfers Overall transfer level: Modified independent Equipment used: Rolling walker (2 wheeled) Ambulation/Gait Ambulation/Gait assistance: Supervision Ambulation Distance (Feet): 100 Feet Assistive device: Rolling walker (2 wheeled) Gait Pattern/deviations: Step-to pattern    Exercises     PT Diagnosis: Difficulty walking  PT Problem List: Decreased activity tolerance;Decreased mobility PT Treatment Interventions: Gait training;Stair training;Therapeutic exercise;DME instruction     PT Goals(Current goals can be found in the care plan section) Acute Rehab PT Goals Patient Stated Goal: go home tomorrow PT Goal Formulation: With patient Time For Goal Achievement: 11/24/13 Potential to Achieve Goals: Good  Visit Information  Last PT Received On: 11/17/13 Assistance Needed: +1 History of Present Illness: admitted for elective revision THR       Prior Functioning  Home Living Family/patient expects to  be discharged to:: Private residence Living Arrangements: Children Type of Home: House Home Access: Stairs to enter Secretary/administratorntrance Stairs-Number of Steps: 4 Home Layout: One level Home Equipment: Walker - standard;Bedside commode Prior Function Level of Independence: Independent Communication Communication: No difficulties    Cognition  Cognition Arousal/Alertness: Awake/alert Behavior During Therapy: WFL for tasks assessed/performed Overall Cognitive Status: Within Functional Limits for tasks assessed    Extremity/Trunk Assessment Upper Extremity Assessment Upper Extremity Assessment: Overall WFL for tasks assessed Lower Extremity Assessment Lower Extremity Assessment: LLE deficits/detail LLE Deficits / Details: did not test hip due to precautions; o/w grossly wfl    Balance Balance Overall balance assessment: No apparent balance deficits (not formally assessed)  End of Session PT - End of Session Activity Tolerance: Patient tolerated treatment well Patient left: in chair;with call bell/phone within reach  GP     Olivia CanterMoton, Shamica Moree M, [place] [phone] 11/17/2013, 1:48 PM

## 2013-11-17 NOTE — Plan of Care (Signed)
Problem: Consults Goal: Diagnosis- Total Joint Replacement Revision Total Hip     

## 2013-11-18 ENCOUNTER — Encounter (HOSPITAL_COMMUNITY): Payer: Self-pay | Admitting: Orthopedic Surgery

## 2013-11-18 LAB — CBC
HCT: 27.9 % — ABNORMAL LOW (ref 39.0–52.0)
Hemoglobin: 9.3 g/dL — ABNORMAL LOW (ref 13.0–17.0)
MCH: 27.8 pg (ref 26.0–34.0)
MCHC: 33.3 g/dL (ref 30.0–36.0)
MCV: 83.5 fL (ref 78.0–100.0)
Platelets: 170 10*3/uL (ref 150–400)
RBC: 3.34 MIL/uL — AB (ref 4.22–5.81)
RDW: 13.2 % (ref 11.5–15.5)
WBC: 7.7 10*3/uL (ref 4.0–10.5)

## 2013-11-18 LAB — WOUND CULTURE
Culture: NO GROWTH
GRAM STAIN: NONE SEEN

## 2013-11-18 NOTE — Care Management Note (Signed)
CARE MANAGEMENT NOTE 11/18/2013  Patient:  Patrick Salas,Patrick Salas   Account Number:  [id number]  Date Initiated:  11/17/2013  Documentation initiated by:  Vance PeperBRADY,Dezmond Downie  Subjective/Objective Assessment:   48 yr old male s/p left total hip revision     Action/Plan:   Case manager spoke with patient concerning home health and DME needs at discharge, Choice offered. [facility], CM called Marlin CanaryMary Y, [facility] liasion to see if the can take patient. He has rolling walker and 3in1.   Anticipated DC Date:  11/18/2013   Anticipated DC Plan:  HOME W HOME HEALTH SERVICES      DC Planning Services  CM consult      [facility]  DURABLE MEDICAL EQUIPMENT   Choice offered to / List presented to:  C-1 Patient   DME arranged  [facility]        HH arranged  HH-2 PT      HH agency  [facility]   Status of service:  Completed, signed off Medicare Important Message given?   (If response is "NO", the following Medicare IM given date fields will be blank) Date Medicare IM given:   Date Additional Medicare IM given:    Discharge Disposition:  HOME W HOME HEALTH SERVICES  Per UR Regulation:    If discussed at Long Length of Stay Meetings, dates discussed:    Comments:

## 2013-11-18 NOTE — Progress Notes (Signed)
PATIENT ID: Patrick Salas  MRN: [id number]  DOB/AGE:  Jul 01, 1966 / 48 y.o.  2 Days Post-Op Procedure(s) (LRB): TOTAL HIP REVISION (Left)    PROGRESS NOTE Subjective: Patient is alert, oriented,no Nausea, no Vomiting, yes passing gas, no Bowel Movement. Taking PO well. Denies SOB, Chest or Calf Pain. Using Incentive Spirometer, PAS in place. Ambulate WBAT Patient reports pain as moderate  .    Objective: Vital signs in last 24 hours: Filed Vitals:   11/17/13 2000 11/17/13 2036 11/17/13 2346 11/18/13 0537  BP:  113/68  122/67  Pulse:  98  86  Temp:  98.8 F (37.1 C)  98 F (36.7 C)  TempSrc:      Resp: 18 18 16 18   Height:      Weight:      SpO2: 96% 97% 96% 94%      Intake/Output from previous day: I/O last 3 completed shifts: In: 2767.9 [P.O.:720; I.V.:2047.9] Out: 1675 [Urine:1675]   Intake/Output this shift:     LABORATORY DATA:  Recent Labs  11/17/13 0452 11/18/13 0348  WBC 8.1 7.7  HGB 10.0* 9.3*  HCT 29.4* 27.9*  PLT 171 170  NA 137  --   K 4.1  --   CL 100  --   CO2 25  --   BUN 18  --   CREATININE 1.52*  --   GLUCOSE 109*  --   CALCIUM 8.5  --     Examination: Neurologically intact Neurovascular intact Sensation intact distally Intact pulses distally Dorsiflexion/Plantar flexion intact Incision: dressing C/D/I No cellulitis present Compartment soft} XR AP&Lat of hip shows well placed\fixed THA  Assessment:   2 Days Post-Op Procedure(s) (LRB): TOTAL HIP REVISION (Left) ADDITIONAL DIAGNOSIS:    Plan: PT/OT WBAT, THA  posterior precautions  DVT Prophylaxis: SCDx72 hrs, ASA 325 mg BID x 2 weeks  DISCHARGE PLAN: Home today   DISCHARGE NEEDS: HHPT, HHRN, Walker and 3-in-1 comode seat

## 2013-11-18 NOTE — Progress Notes (Signed)
Patient provided with discharge instructions and follow up instructions. He is set up with HHPT and has all his home equipment. He is going home with his sister at this time via private vehicle.

## 2013-11-18 NOTE — Discharge Summary (Signed)
Patient ID: Patrick Salas MRN: [id number] DOB/AGE: July 10, 1966 48 y.o.  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 Hypertension   . Complication of anesthesia 2008    woke up during surgery- saw lights , did not hear anything.."maybe 2 sceonds".  Marland Kitchen. PONV (postoperative nausea and vomiting)   . Family history of anesthesia complication     "whole family nausea"  . History of kidney stones   . Hemorrhoids     Surgeries: Procedure(s): TOTAL HIP REVISION on 11/16/2013   Consultants:    Discharged Condition: Improved  Hospital Course: Patrick Salas is an 48 y.o. male who was admitted 11/16/2013 for operative treatment of<principal problem not specified>. Patient has severe unremitting pain that affects sleep, daily activities, and work/hobbies. After pre-op clearance the patient was taken to the operating room on 11/16/2013 and underwent  Procedure(s): TOTAL HIP REVISION.    Patient was given perioperative antibiotics: Anti-infectives   Start     Dose/Rate Route Frequency Ordered Stop   11/16/13 0600  ceFAZolin (ANCEF) IVPB 2 g/50 mL premix     2 g 100 mL/hr over 30 Minutes Intravenous On call to O.R. 11/15/13 1412 11/16/13 1342       Patient was given sequential compression devices, early ambulation, and chemoprophylaxis to prevent DVT.  Patient benefited maximally from hospital stay and there were no complications.    Recent vital signs: Patient Vitals for the past 24 hrs:  BP Temp Temp src Pulse Resp SpO2  11/18/13 0537 122/67 mmHg 98 F (36.7 C) - 86 18 94 %  11/17/13 2346 - - - - 16 96 %  11/17/13 2036 113/68 mmHg 98.8 F (37.1 C) - 98 18 97 %  11/17/13 2000 - - - - 18 96 %  11/17/13 1300 110/68 mmHg 98.5 F (36.9 C) Oral 88 18 98 %  11/17/13 1106 132/89 mmHg - - - - -     Recent  laboratory studies:  Recent Labs  11/17/13 0452 11/18/13 0348  WBC 8.1 7.7  HGB 10.0* 9.3*  HCT 29.4* 27.9*  PLT 171 170  NA 137  --   K 4.1  --   CL 100  --   CO2 25  --   BUN 18  --   CREATININE 1.52*  --   GLUCOSE 109*  --   CALCIUM 8.5  --      Discharge Medications:     Medication List         acetaminophen 500 MG tablet  Commonly known as:  TYLENOL  Take 1,000 mg by mouth every 6 (six) hours as needed.     amLODipine 10 MG tablet  Commonly known as:  NORVASC  Take 10 mg by mouth daily.     aspirin EC 325 MG tablet  Take 1 tablet (325 mg total) by mouth 2 (two) times daily.     losartan 100 MG tablet  Commonly known as:  COZAAR  Take 100 mg by mouth daily.     methocarbamol 500 MG tablet  Commonly known as:  ROBAXIN  Take 1 tablet (500 mg total) by mouth 2 (two) times daily with a meal.     oxyCODONE-acetaminophen 5-325 MG per tablet  Commonly known as:  ROXICET  Take 1 tablet by mouth every 4 (four) hours as needed.  Diagnostic Studies: Dg Chest 2 View  11/09/2013   CLINICAL DATA:  Preoperative hip replacements; hypertension  EXAM: CHEST  2 VIEW  COMPARISON:  Feb 05, 2007  FINDINGS: There is a stable calcified granuloma in the left base. Elsewhere, the lungs are clear. Heart size and pulmonary vascularity are normal. No adenopathy. No bone lesions.  IMPRESSION: Evidence of prior granulomatous disease.  No edema or consolidation.   Electronically Signed   By: Bretta Bang M.D.   On: 11/09/2013 12:09   Dg Pelvis Portable  11/16/2013   CLINICAL DATA:  Left hip surgery.  EXAM: PORTABLE PELVIS 1-2 VIEWS  COMPARISON:  MR HIP*L* W/O CM dated 12/18/2010  FINDINGS: Patient has had bilateral hip replacements with good anatomic alignment. No acute bony abnormality. Calcifications in pelvis consistent with phleboliths.  IMPRESSION: No acute abnormality.  Patient has had bilateral hip replacements.   Electronically Signed   By: Maisie Fus  Register   On:  11/16/2013 16:18    Disposition:       Discharge Orders   Future Orders Complete By Expires   Call MD / Call 911  As directed    Comments:     If you experience chest pain or shortness of breath, CALL 911 and be transported to the hospital emergency room.  If you develope a fever above 101 F, pus (white drainage) or increased drainage or redness at the wound, or calf pain, call your surgeon's office.   Change dressing  As directed    Comments:     You may change your dressing on day 5, then change the dressing daily with sterile 4 x 4 inch gauze dressing and paper tape.  You may clean the incision with alcohol prior to redressing   Constipation Prevention  As directed    Comments:     Drink plenty of fluids.  Prune juice may be helpful.  You may use a stool softener, such as Colace (over the counter) 100 mg twice a day.  Use MiraLax (over the counter) for constipation as needed.   Diet - low sodium heart healthy  As directed    Discharge instructions  As directed    Comments:     Follow up in office with Dr. Turner Daniels in 2 weeks   Do not sit on low chairs, stoools or toilet seats, as it may be difficult to get up from low surfaces  As directed    Driving restrictions  As directed    Comments:     No driving for 2 weeks   Follow the hip precautions as taught in Physical Therapy  As directed    Increase activity slowly as tolerated  As directed    Patient may shower  As directed    Comments:     You may shower without a dressing once there is no drainage.  Do not wash over the wound.  If drainage remains, cover wound with plastic wrap and then shower.      Follow-up Information   Follow up with Nestor Lewandowsky, MD In 2 weeks.   Specialty:  Orthopedic Surgery   Contact information:   [address] [place] [address] [phone]        Signed: Vear Clock, Cayce Quezada R 11/18/2013, 8:16 AM

## 2013-11-18 NOTE — Progress Notes (Signed)
Orthopedic Tech Progress Note Patient Details:  Patrick Salas March 14, 1966 [id number]  Ortho Devices Type of Ortho Device: Crutches Ortho Device/Splint Location: put ohf on bed Ortho Device/Splint Interventions: Application   [name], [name] 11/18/2013, 12:02 PM

## 2013-11-18 NOTE — Progress Notes (Signed)
Physical Therapy Treatment Patient Details Name: Patrick Salas MRN: [id number] DOB: Jan 05, 1966 Today's Date: 11/18/2013 Time: 8413-2440 PT Time Calculation (min): 26 min  PT Assessment / Plan / Recommendation  History of Present Illness admitted for elective revision THR   PT Comments   Patient moving well with all bed mobility and transfers.  Modified independent with all mobility including stairs.  Patient has all necessary equipment and will need f/u PT at [facility] progressing to OP.  Patient is ready for d/c from PT standpoint.    Follow Up Recommendations  Home health PT;Outpatient PT     Does the patient have the potential to tolerate intense rehabilitation     Barriers to Discharge        Equipment Recommendations  None recommended by PT    Recommendations for Other Services    Frequency 7X/week   Progress towards PT Goals Progress towards PT goals: Goals met/education completed, patient discharged from PT  Plan Current plan remains appropriate    Precautions / Restrictions     Pertinent Vitals/Pain Patient reports hip is feeling better and was premedicated prior to session    Mobility  Bed Mobility Overal bed mobility: Modified Independent General bed mobility comments: did raise head of bed and hooked right foot under left leg to move left leg.  we did discuss using leg lifter which patient reports he has at home. Transfers Overall transfer level: Modified independent Equipment used: Rolling walker (2 wheeled) Ambulation/Gait Ambulation/Gait assistance: Modified independent (Device/Increase time) Ambulation Distance (Feet): 80 Feet Assistive device: Rolling walker (2 wheeled) Gait Pattern/deviations: Step-through pattern General Gait Details: also ambulated back from PT gym using crutches, modified independent, 80' Stairs: Yes Stairs assistance: Modified independent (Device/Increase time) Stair Management: With crutches;Step to pattern;No rails Number of  Stairs: 3    Exercises Total Joint Exercises Ankle Circles/Pumps: AROM;Left;10 reps;Seated Quad Sets: AROM;Left;10 reps;Seated Long Arc Quad: AROM;Left;10 reps;Seated   PT Diagnosis:    PT Problem List:   PT Treatment Interventions:     PT Goals (current goals can now be found in the care plan section)    Visit Information  Last PT Received On: 11/18/13 Assistance Needed: +1 History of Present Illness: admitted for elective revision THR    Subjective Data      Cognition  Cognition Arousal/Alertness: Awake/alert Behavior During Therapy: WFL for tasks assessed/performed Overall Cognitive Status: Within Functional Limits for tasks assessed    Balance  Balance Overall balance assessment: No apparent balance deficits (not formally assessed)  End of Session PT - End of Session Activity Tolerance: Patient tolerated treatment well Patient left: in chair;with call bell/phone within reach   [place], [place], [place] 11/18/2013, 12:08 PM

## 2013-11-21 LAB — ANAEROBIC CULTURE: GRAM STAIN: NONE SEEN

## 2014-03-28 ENCOUNTER — Other Ambulatory Visit: Payer: Self-pay | Admitting: Orthopedic Surgery

## 2014-04-19 ENCOUNTER — Encounter (HOSPITAL_COMMUNITY): Payer: Self-pay | Admitting: Pharmacy Technician

## 2014-04-25 ENCOUNTER — Encounter (HOSPITAL_COMMUNITY)
Admission: RE | Admit: 2014-04-25 | Discharge: 2014-04-25 | Disposition: A | Discharge status: Home / Self Care | Payer: Medicare Other | Source: Ambulatory Visit | Attending: Orthopedic Surgery | Admitting: Orthopedic Surgery

## 2014-04-25 ENCOUNTER — Encounter (HOSPITAL_COMMUNITY): Payer: Self-pay

## 2014-04-25 DIAGNOSIS — Z01818 Encounter for other preprocedural examination: Secondary | ICD-10-CM | POA: Diagnosis not present

## 2014-04-25 DIAGNOSIS — Z01812 Encounter for preprocedural laboratory examination: Secondary | ICD-10-CM | POA: Diagnosis not present

## 2014-04-25 LAB — BASIC METABOLIC PANEL
ANION GAP: 15 (ref 5–15)
BUN: 24 mg/dL — ABNORMAL HIGH (ref 6–23)
CO2: 22 mEq/L (ref 19–32)
CREATININE: 1.76 mg/dL — AB (ref 0.50–1.35)
Calcium: 9.3 mg/dL (ref 8.4–10.5)
Chloride: 101 mEq/L (ref 96–112)
GFR calc non Af Amer: 45 mL/min — ABNORMAL LOW (ref >90)
GFR, EST AFRICAN AMERICAN: 52 mL/min — AB (ref >90)
Glucose, Bld: 108 mg/dL — ABNORMAL HIGH (ref 70–99)
POTASSIUM: 3.7 meq/L (ref 3.7–5.3)
Sodium: 138 mEq/L (ref 137–147)

## 2014-04-25 LAB — CBC WITH DIFFERENTIAL/PLATELET
BASOS ABS: 0.1 10*3/uL (ref 0.0–0.1)
BASOS PCT: 1 % (ref 0–1)
Eosinophils Absolute: 0.2 10*3/uL (ref 0.0–0.7)
Eosinophils Relative: 3 % (ref 0–5)
HCT: 39.6 % (ref 39.0–52.0)
HEMOGLOBIN: 13 g/dL (ref 13.0–17.0)
Lymphocytes Relative: 25 % (ref 12–46)
Lymphs Abs: 2.1 10*3/uL (ref 0.7–4.0)
MCH: 27.1 pg (ref 26.0–34.0)
MCHC: 32.8 g/dL (ref 30.0–36.0)
MCV: 82.5 fL (ref 78.0–100.0)
MONOS PCT: 4 % (ref 3–12)
Monocytes Absolute: 0.3 10*3/uL (ref 0.1–1.0)
NEUTROS ABS: 5.6 10*3/uL (ref 1.7–7.7)
NEUTROS PCT: 67 % (ref 43–77)
PLATELETS: 239 10*3/uL (ref 150–400)
RBC: 4.8 MIL/uL (ref 4.22–5.81)
RDW: 14.2 % (ref 11.5–15.5)
WBC: 8.2 10*3/uL (ref 4.0–10.5)

## 2014-04-25 LAB — SURGICAL PCR SCREEN
MRSA, PCR: NEGATIVE
STAPHYLOCOCCUS AUREUS: NEGATIVE

## 2014-04-25 LAB — TYPE AND SCREEN
ABO/RH(D): O NEG
Antibody Screen: NEGATIVE

## 2014-04-25 LAB — APTT: APTT: 30 s (ref 24–37)

## 2014-04-25 LAB — PROTIME-INR
INR: 0.98 (ref 0.00–1.49)
PROTHROMBIN TIME: 13 s (ref 11.6–15.2)

## 2014-04-25 MED ORDER — CHLORHEXIDINE GLUCONATE 4 % EX LIQD: 60.0000 mL | Freq: Once | CUTANEOUS | Status: DC

## 2014-04-25 NOTE — Progress Notes (Addendum)
Dr.John Valentina LucksGriffin is Medical Md  Pt doesn't have a cardiologist  Denies ever having an echo/stress test/heart cath   EKG and CXR in epic from 11-09-13

## 2014-04-25 NOTE — Pre-Procedure Instructions (Signed)
Patrick Salas  04/25/2014   Your procedure is scheduled on:  Wed, Aug 12 @ 12:50 PM  [facility]  at 10:50 AM.  Call this number if you have problems the morning of surgery: [phone]   Remember:   Do not eat food or drink liquids after midnight.   Take these medicines the morning of surgery with A SIP OF WATER: Amlodipine(Norvasc)               No Goody's,BC's,Aleve,Aspirin,Ibuprofen,Fish Oil,or any Herbal Medications   Do not wear jewelry  Do not wear lotions, powders, or colognes. You may wear deodorant.  Men may shave face and neck.  Do not bring valuables to the hospital.  [facility] is not responsible                  for any belongings or valuables.               Contacts, dentures or bridgework may not be worn into surgery.  Leave suitcase in the car. After surgery it may be brought to your room.  For patients admitted to the hospital, discharge time is determined by your                treatment team.                  Special Instructions:  [place] - Preparing for Surgery  Before surgery, you can play an important role.  Because skin is not sterile, your skin needs to be as free of germs as possible.  You can reduce the number of germs on you skin by washing with CHG (chlorahexidine gluconate) soap before surgery.  CHG is an antiseptic cleaner which kills germs and bonds with the skin to continue killing germs even after washing.  Please DO NOT use if you have an allergy to CHG or antibacterial soaps.  If your skin becomes reddened/irritated stop using the CHG and inform your nurse when you arrive at Short Stay.  Do not shave (including legs and underarms) for at least 48 hours prior to the first CHG shower.  You may shave your face.  Please follow these instructions carefully:   1.  Shower with CHG Soap the night before surgery and the                                morning of Surgery.  2.  If you choose to wash your hair, wash your hair  first as usual with your       normal shampoo.  3.  After you shampoo, rinse your hair and body thoroughly to remove the                      Shampoo.  4.  Use CHG as you would any other liquid soap.  You can apply chg directly       to the skin and wash gently with scrungie or a clean washcloth.  5.  Apply the CHG Soap to your body ONLY FROM THE NECK DOWN.        Do not use on open wounds or open sores.  Avoid contact with your eyes,       ears, mouth and genitals (private parts).  Wash genitals (private parts)       with your normal soap.  6.  Wash thoroughly, paying special  attention to the area where your surgery        will be performed.  7.  Thoroughly rinse your body with warm water from the neck down.  8.  DO NOT shower/wash with your normal soap after using and rinsing off       the CHG Soap.  9.  Pat yourself dry with a clean towel.            10.  Wear clean pajamas.            11.  Place clean sheets on your bed the night of your first shower and do not        sleep with pets.  Day of Surgery  Do not apply any lotions/deoderants the morning of surgery.  Please wear clean clothes to the hospital/surgery center.     Please read over the following fact sheets that you were given: Pain Booklet, Coughing and Deep Breathing, Blood Transfusion Information, MRSA Information and Surgical Site Infection Prevention

## 2014-04-26 LAB — URINE CULTURE
Colony Count: NO GROWTH
Culture: NO GROWTH

## 2014-05-02 MED ORDER — DEXTROSE-NACL 5-0.45 % IV SOLN: INTRAVENOUS | Status: DC

## 2014-05-02 MED ORDER — CEFAZOLIN SODIUM-DEXTROSE 2-3 GM-% IV SOLR
2.0000 g | INTRAVENOUS | Status: AC
  Administered 2014-05-03: 2 g via INTRAVENOUS
  Filled 2014-05-02: qty 50

## 2014-05-02 NOTE — H&P (Signed)
TOTAL HIP REVISION ADMISSION H&P  Patient is admitted for right revision total hip arthroplasty.  Subjective:  Chief Complaint: right hip pain  HPI: Patrick Salas, 48 y.o. male, has a history of pain and functional disability in the right hip due to arthritis and patient has failed non-surgical conservative treatments for greater than 12 weeks to include NSAID's and/or analgesics, flexibility and strengthening excercises and activity modification. The indications for the revision total hip arthroplasty are bearing surface wear leading to  symptomatic synovitis.  Onset of symptoms was gradual starting 6 years ago with gradually worsening course since that time.  Prior procedures on the right hip include arthroplasty.  Patient currently rates pain in the right hip at 10 out of 10 with activity.  There is worsening of pain with activity and weight bearing, pain that interfers with activities of daily living and pain with passive range of motion. Patient has evidence of fluid collection by imaging studies.  This condition presents safety issues increasing the risk of falls.  There is no current active infection.  Patient Active Problem List   Diagnosis Date Noted  . 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 Yeast infection of the perianal skin 03/01/2013  . Internal and external bleeding hemorrhoids 12/13/2012   Past Medical History  Diagnosis Date  . Complication of anesthesia 2008    woke up during surgery- saw lights , did not hear anything.."maybe 2 sceonds".  Marland Kitchen. PONV (postoperative nausea and vomiting)   . Family history of anesthesia complication     "whole family nausea"  . History of kidney stones   . Hemorrhoids   . Hypertension     takes Amlodipine and Losartan daily    Past Surgical History  Procedure Laterality Date  . Joint replacement Bilateral 2008/2009    2008- left, 2009- right  . Vascular fibular graft Bilateral 2003  . Lithotripsy  2002  .  Wisdom tooth extraction  at age 48  . Total hip revision Left 11/16/2013    Procedure: TOTAL HIP REVISION;  Surgeon: Nestor LewandowskyFrank J Rowan, MD;  Location: [facility] OR;  Service: Orthopedics;  Laterality: Left;  . Left hip arthroscopy  2007  . Colonoscopy      No prescriptions prior to admission   Allergies  Allergen Reactions  . Other Other (See Comments)    Do not give patient any steroids  . Prednisone Other (See Comments)    Lost blood flow to legs    History  Substance Use Topics  . Smoking status: Never Smoker   . Smokeless tobacco: Not on file  . Alcohol Use: No    No family history on file.    Review of Systems  Constitutional: Negative.   HENT: Negative.   Eyes: Negative.   Respiratory: Negative.   Cardiovascular: Negative.   Gastrointestinal: Negative.   Genitourinary: Negative.   Musculoskeletal: Positive for joint pain.  Skin: Negative.   Neurological: Negative.   Endo/Heme/Allergies: Negative.   Psychiatric/Behavioral: Negative.     Objective:  Physical Exam  Constitutional: He is oriented to person, place, and time. He appears well-developed and well-nourished.  HENT:  Head: Normocephalic and atraumatic.  Eyes: Pupils are equal, round, and reactive to light.  Neck: Normal range of motion. Neck supple.  Cardiovascular: Intact distal pulses.   Respiratory: Effort normal and breath sounds normal.  Musculoskeletal:  Foot tap is negative internal and external rotation are 30 each he can flex and external rotate and tied issue now  without difficulty.    Neurological: He is alert and oriented to person, place, and time.  Skin: Skin is warm and dry.  Psychiatric: He has a normal mood and affect. His behavior is normal. Judgment and thought content normal.    Vital signs in last 24 hours:     Labs:   Estimated body mass index is 32.21 kg/(m^2) as calculated from the following:   Height as of 11/16/13: 5\' 7"  (1.702 m).   Weight as of 11/09/13: 93.305 kg (205 lb  11.2 oz).  Imaging Review:  Plain radiographs demonstrate AP pelvis and crosstable lateral of both total hip show well-placed well fixed prosthesis with no evidence of loosening or migration of the implants.  Assessment/Plan:  End stage arthritis, right hip(s) with failed previous arthroplasty.  The patient history, physical examination, clinical judgement of the provider and imaging studies are consistent with end stage degenerative joint disease of the right hip(s), previous total hip arthroplasty. Revision total hip arthroplasty is deemed medically necessary. The treatment options including medical management, injection therapy, arthroscopy and arthroplasty were discussed at length. The risks and benefits of total hip arthroplasty were presented and reviewed. The risks due to aseptic loosening, infection, stiffness, dislocation/subluxation,  thromboembolic complications and other imponderables were discussed.  The patient acknowledged the explanation, agreed to proceed with the plan and consent was signed. Patient is being admitted for inpatient treatment for surgery, pain control, PT, OT, prophylactic antibiotics, VTE prophylaxis, progressive ambulation and ADL's and discharge planning. The patient is planning to be discharged home with home health services

## 2014-05-03 ENCOUNTER — Inpatient Hospital Stay (HOSPITAL_COMMUNITY)
Admission: RE | Admit: 2014-05-03 | Discharge: 2014-05-05 | DRG: 467 | Disposition: A | Discharge status: Home Health | Payer: Medicare Other | Source: Ambulatory Visit | Attending: Orthopedic Surgery | Admitting: Orthopedic Surgery

## 2014-05-03 ENCOUNTER — Encounter (HOSPITAL_COMMUNITY): Payer: Self-pay | Admitting: *Deleted

## 2014-05-03 ENCOUNTER — Encounter (HOSPITAL_COMMUNITY): Payer: Medicare Other | Admitting: Anesthesiology

## 2014-05-03 ENCOUNTER — Encounter (HOSPITAL_COMMUNITY)
Admission: RE | Disposition: A | Discharge status: Home Health | Payer: Self-pay | Source: Ambulatory Visit | Attending: Orthopedic Surgery

## 2014-05-03 ENCOUNTER — Inpatient Hospital Stay (HOSPITAL_COMMUNITY): Payer: Medicare Other | Admitting: Anesthesiology

## 2014-05-03 ENCOUNTER — Inpatient Hospital Stay (HOSPITAL_COMMUNITY): Payer: Medicare Other

## 2014-05-03 DIAGNOSIS — M25559 Pain in unspecified hip: Secondary | ICD-10-CM | POA: Diagnosis present

## 2014-05-03 DIAGNOSIS — Z888 Allergy status to other drugs, medicaments and biological substances status: Secondary | ICD-10-CM | POA: Diagnosis not present

## 2014-05-03 DIAGNOSIS — I10 Essential (primary) hypertension: Secondary | ICD-10-CM | POA: Diagnosis present

## 2014-05-03 DIAGNOSIS — T8489XA Other specified complication of internal orthopedic prosthetic devices, implants and grafts, initial encounter: Principal | ICD-10-CM | POA: Diagnosis present

## 2014-05-03 DIAGNOSIS — M1610 Unilateral primary osteoarthritis, unspecified hip: Secondary | ICD-10-CM | POA: Diagnosis present

## 2014-05-03 DIAGNOSIS — Z87442 Personal history of urinary calculi: Secondary | ICD-10-CM | POA: Diagnosis not present

## 2014-05-03 DIAGNOSIS — D62 Acute posthemorrhagic anemia: Secondary | ICD-10-CM | POA: Diagnosis not present

## 2014-05-03 DIAGNOSIS — Y831 Surgical operation with implant of artificial internal device as the cause of abnormal reaction of the patient, or of later complication, without mention of misadventure at the time of the procedure: Secondary | ICD-10-CM | POA: Diagnosis present

## 2014-05-03 DIAGNOSIS — T8484XA Pain due to internal orthopedic prosthetic devices, implants and grafts, initial encounter: Secondary | ICD-10-CM

## 2014-05-03 DIAGNOSIS — Z79899 Other long term (current) drug therapy: Secondary | ICD-10-CM | POA: Diagnosis not present

## 2014-05-03 DIAGNOSIS — G4730 Sleep apnea, unspecified: Secondary | ICD-10-CM | POA: Diagnosis present

## 2014-05-03 DIAGNOSIS — M169 Osteoarthritis of hip, unspecified: Secondary | ICD-10-CM | POA: Diagnosis present

## 2014-05-03 DIAGNOSIS — Z6832 Body mass index (BMI) 32.0-32.9, adult: Secondary | ICD-10-CM | POA: Diagnosis not present

## 2014-05-03 DIAGNOSIS — Z96649 Presence of unspecified artificial hip joint: Secondary | ICD-10-CM | POA: Diagnosis not present

## 2014-05-03 DIAGNOSIS — Z7982 Long term (current) use of aspirin: Secondary | ICD-10-CM | POA: Diagnosis not present

## 2014-05-03 HISTORY — PX: TOTAL HIP REVISION: SHX763

## 2014-05-03 SURGERY — TOTAL HIP REVISION
Anesthesia: General | Laterality: Right

## 2014-05-03 MED ORDER — ONDANSETRON HCL 4 MG/2ML IJ SOLN
INTRAMUSCULAR | Status: AC
  Filled 2014-05-03: qty 2

## 2014-05-03 MED ORDER — NEOSTIGMINE METHYLSULFATE 10 MG/10ML IV SOLN
INTRAVENOUS | Status: AC
  Filled 2014-05-03: qty 2

## 2014-05-03 MED ORDER — LOSARTAN POTASSIUM 50 MG PO TABS
100.0000 mg | ORAL_TABLET | Freq: Every day | ORAL | Status: DC
  Administered 2014-05-04 – 2014-05-05 (×2): 100 mg via ORAL
  Filled 2014-05-03 (×3): qty 2

## 2014-05-03 MED ORDER — LACTATED RINGERS IV SOLN
INTRAVENOUS | Status: DC
  Administered 2014-05-03: 11:00:00 via INTRAVENOUS

## 2014-05-03 MED ORDER — ONDANSETRON HCL 4 MG/2ML IJ SOLN
INTRAMUSCULAR | Status: DC | PRN
  Administered 2014-05-03: 4 mg via INTRAVENOUS

## 2014-05-03 MED ORDER — NEOSTIGMINE METHYLSULFATE 10 MG/10ML IV SOLN
INTRAVENOUS | Status: DC | PRN
  Administered 2014-05-03: 4 mg via INTRAVENOUS

## 2014-05-03 MED ORDER — BUPIVACAINE-EPINEPHRINE (PF) 0.5% -1:200000 IJ SOLN
INTRAMUSCULAR | Status: AC
  Filled 2014-05-03: qty 30

## 2014-05-03 MED ORDER — EPHEDRINE SULFATE 50 MG/ML IJ SOLN
INTRAMUSCULAR | Status: DC | PRN
Start: 2014-05-03 — End: 2014-05-03
  Administered 2014-05-03 (×2): 5 mg via INTRAVENOUS
  Administered 2014-05-03: 10 mg via INTRAVENOUS
  Administered 2014-05-03 (×2): 5 mg via INTRAVENOUS

## 2014-05-03 MED ORDER — OXYCODONE HCL 5 MG PO TABS: 5.0000 mg | ORAL_TABLET | ORAL | Status: DC | PRN

## 2014-05-03 MED ORDER — HYDROCODONE-ACETAMINOPHEN 5-325 MG PO TABS
1.0000 | ORAL_TABLET | ORAL | Status: DC | PRN
Start: 2014-05-03 — End: 2014-05-05
  Administered 2014-05-03 – 2014-05-04 (×2): 1 via ORAL
  Administered 2014-05-04 – 2014-05-05 (×4): 2 via ORAL
  Filled 2014-05-03: qty 2
  Filled 2014-05-03: qty 1
  Filled 2014-05-03 (×4): qty 2

## 2014-05-03 MED ORDER — METOCLOPRAMIDE HCL 5 MG/ML IJ SOLN
5.0000 mg | Freq: Three times a day (TID) | INTRAMUSCULAR | Status: DC | PRN
  Filled 2014-05-03: qty 2

## 2014-05-03 MED ORDER — ASPIRIN EC 325 MG PO TBEC
325.0000 mg | DELAYED_RELEASE_TABLET | Freq: Every day | ORAL | Status: DC
  Administered 2014-05-04 – 2014-05-05 (×2): 325 mg via ORAL
  Filled 2014-05-03 (×3): qty 1

## 2014-05-03 MED ORDER — HYDROMORPHONE HCL PF 1 MG/ML IJ SOLN
INTRAMUSCULAR | Status: AC
  Filled 2014-05-03: qty 1

## 2014-05-03 MED ORDER — SENNOSIDES-DOCUSATE SODIUM 8.6-50 MG PO TABS
1.0000 | ORAL_TABLET | Freq: Every evening | ORAL | Status: DC | PRN
  Filled 2014-05-03: qty 1

## 2014-05-03 MED ORDER — ACETAMINOPHEN 325 MG PO TABS: 650.0000 mg | ORAL_TABLET | Freq: Four times a day (QID) | ORAL | Status: DC | PRN

## 2014-05-03 MED ORDER — SODIUM CHLORIDE 0.9 % IR SOLN
Status: DC | PRN
  Administered 2014-05-03: 1000 mL

## 2014-05-03 MED ORDER — FENTANYL CITRATE 0.05 MG/ML IJ SOLN
INTRAMUSCULAR | Status: DC | PRN
  Administered 2014-05-03 (×2): 50 ug via INTRAVENOUS
  Administered 2014-05-03: 100 ug via INTRAVENOUS
  Administered 2014-05-03 (×2): 25 ug via INTRAVENOUS

## 2014-05-03 MED ORDER — DOCUSATE SODIUM 100 MG PO CAPS
100.0000 mg | ORAL_CAPSULE | Freq: Two times a day (BID) | ORAL | Status: DC
  Administered 2014-05-03 – 2014-05-05 (×4): 100 mg via ORAL
  Filled 2014-05-03 (×5): qty 1

## 2014-05-03 MED ORDER — MENTHOL 3 MG MT LOZG
1.0000 | LOZENGE | OROMUCOSAL | Status: DC | PRN
  Filled 2014-05-03: qty 9

## 2014-05-03 MED ORDER — PHENOL 1.4 % MT LIQD: 1.0000 | OROMUCOSAL | Status: DC | PRN

## 2014-05-03 MED ORDER — LIDOCAINE HCL (CARDIAC) 20 MG/ML IV SOLN
INTRAVENOUS | Status: AC
  Filled 2014-05-03: qty 5

## 2014-05-03 MED ORDER — HYDROMORPHONE HCL PF 1 MG/ML IJ SOLN
0.2500 mg | INTRAMUSCULAR | Status: DC | PRN
  Administered 2014-05-03 (×5): 0.5 mg via INTRAVENOUS

## 2014-05-03 MED ORDER — BUPIVACAINE-EPINEPHRINE 0.5% -1:200000 IJ SOLN
INTRAMUSCULAR | Status: DC | PRN
  Administered 2014-05-03: 21 mL

## 2014-05-03 MED ORDER — PROPOFOL 10 MG/ML IV BOLUS
INTRAVENOUS | Status: AC
  Filled 2014-05-03: qty 20

## 2014-05-03 MED ORDER — ROCURONIUM BROMIDE 100 MG/10ML IV SOLN
INTRAVENOUS | Status: DC | PRN
  Administered 2014-05-03: 40 mg via INTRAVENOUS

## 2014-05-03 MED ORDER — GLYCOPYRROLATE 0.2 MG/ML IJ SOLN
INTRAMUSCULAR | Status: AC
  Filled 2014-05-03: qty 3

## 2014-05-03 MED ORDER — METOCLOPRAMIDE HCL 5 MG PO TABS
5.0000 mg | ORAL_TABLET | Freq: Three times a day (TID) | ORAL | Status: DC | PRN
  Filled 2014-05-03: qty 2

## 2014-05-03 MED ORDER — OXYCODONE-ACETAMINOPHEN 5-325 MG PO TABS: 1.0000 | ORAL_TABLET | ORAL | Status: AC | PRN

## 2014-05-03 MED ORDER — PROPOFOL 10 MG/ML IV BOLUS
INTRAVENOUS | Status: DC | PRN
  Administered 2014-05-03: 150 mg via INTRAVENOUS

## 2014-05-03 MED ORDER — ONDANSETRON HCL 4 MG/2ML IJ SOLN
4.0000 mg | Freq: Four times a day (QID) | INTRAMUSCULAR | Status: DC | PRN
  Administered 2014-05-03: 4 mg via INTRAVENOUS

## 2014-05-03 MED ORDER — LACTATED RINGERS IV SOLN: INTRAVENOUS | Status: DC

## 2014-05-03 MED ORDER — AMLODIPINE BESYLATE 10 MG PO TABS
10.0000 mg | ORAL_TABLET | Freq: Every day | ORAL | Status: DC
  Administered 2014-05-04 – 2014-05-05 (×2): 10 mg via ORAL
  Filled 2014-05-03 (×2): qty 1

## 2014-05-03 MED ORDER — FENTANYL CITRATE 0.05 MG/ML IJ SOLN
INTRAMUSCULAR | Status: AC
  Filled 2014-05-03: qty 5

## 2014-05-03 MED ORDER — DIPHENHYDRAMINE HCL 25 MG PO CAPS
25.0000 mg | ORAL_CAPSULE | Freq: Four times a day (QID) | ORAL | Status: DC | PRN
  Administered 2014-05-03: 25 mg via ORAL
  Filled 2014-05-03: qty 1

## 2014-05-03 MED ORDER — ACETAMINOPHEN 650 MG RE SUPP: 650.0000 mg | Freq: Four times a day (QID) | RECTAL | Status: DC | PRN

## 2014-05-03 MED ORDER — LIDOCAINE HCL (CARDIAC) 20 MG/ML IV SOLN
INTRAVENOUS | Status: DC | PRN
  Administered 2014-05-03: 60 mg via INTRAVENOUS

## 2014-05-03 MED ORDER — METHOCARBAMOL 500 MG PO TABS: 500.0000 mg | ORAL_TABLET | Freq: Two times a day (BID) | ORAL | Status: AC

## 2014-05-03 MED ORDER — DIPHENHYDRAMINE HCL 12.5 MG/5ML PO ELIX
12.5000 mg | ORAL_SOLUTION | ORAL | Status: DC | PRN
  Filled 2014-05-03: qty 10

## 2014-05-03 MED ORDER — BISACODYL 5 MG PO TBEC
5.0000 mg | DELAYED_RELEASE_TABLET | Freq: Every day | ORAL | Status: DC | PRN
  Filled 2014-05-03: qty 1

## 2014-05-03 MED ORDER — GLYCOPYRROLATE 0.2 MG/ML IJ SOLN
INTRAMUSCULAR | Status: DC | PRN
  Administered 2014-05-03: .6 mg via INTRAVENOUS

## 2014-05-03 MED ORDER — MIDAZOLAM HCL 2 MG/2ML IJ SOLN
INTRAMUSCULAR | Status: AC
  Filled 2014-05-03: qty 2

## 2014-05-03 MED ORDER — ONDANSETRON HCL 4 MG PO TABS: 4.0000 mg | ORAL_TABLET | Freq: Four times a day (QID) | ORAL | Status: DC | PRN

## 2014-05-03 MED ORDER — ASPIRIN EC 325 MG PO TBEC: 325.0000 mg | DELAYED_RELEASE_TABLET | Freq: Two times a day (BID) | ORAL | Status: AC

## 2014-05-03 MED ORDER — LACTATED RINGERS IV SOLN
INTRAVENOUS | Status: DC | PRN
  Administered 2014-05-03 (×2): via INTRAVENOUS

## 2014-05-03 MED ORDER — ARTIFICIAL TEARS OP OINT
TOPICAL_OINTMENT | OPHTHALMIC | Status: DC | PRN
  Administered 2014-05-03: 1 via OPHTHALMIC

## 2014-05-03 MED ORDER — FLEET ENEMA 7-19 GM/118ML RE ENEM: 1.0000 | ENEMA | Freq: Once | RECTAL | Status: AC | PRN

## 2014-05-03 MED ORDER — HYDROMORPHONE HCL PF 1 MG/ML IJ SOLN
1.0000 mg | INTRAMUSCULAR | Status: DC | PRN
  Administered 2014-05-03: 1 mg via INTRAVENOUS
  Filled 2014-05-03: qty 1

## 2014-05-03 MED ORDER — KCL IN DEXTROSE-NACL 20-5-0.45 MEQ/L-%-% IV SOLN
INTRAVENOUS | Status: DC
  Administered 2014-05-04: via INTRAVENOUS
  Filled 2014-05-03 (×8): qty 1000

## 2014-05-03 MED ORDER — SODIUM CHLORIDE 0.9 % IV SOLN
1000.0000 mg | INTRAVENOUS | Status: AC
  Administered 2014-05-03: 1000 mg via INTRAVENOUS
  Filled 2014-05-03: qty 10

## 2014-05-03 MED ORDER — MIDAZOLAM HCL 5 MG/5ML IJ SOLN
INTRAMUSCULAR | Status: DC | PRN
  Administered 2014-05-03: 2 mg via INTRAVENOUS

## 2014-05-03 MED ORDER — METHOCARBAMOL 500 MG PO TABS
500.0000 mg | ORAL_TABLET | Freq: Four times a day (QID) | ORAL | Status: DC | PRN
  Filled 2014-05-03: qty 1

## 2014-05-03 MED ORDER — METHOCARBAMOL 1000 MG/10ML IJ SOLN
500.0000 mg | Freq: Four times a day (QID) | INTRAVENOUS | Status: DC | PRN
  Administered 2014-05-03: 500 mg via INTRAVENOUS
  Filled 2014-05-03: qty 5

## 2014-05-03 SURGICAL SUPPLY — 71 items
BLADE SAW SAG 73X25 THK (BLADE)
BLADE SAW SGTL 73X25 THK (BLADE) IMPLANT
BOWL SMART MIX CTS (DISPOSABLE) IMPLANT
BRUSH FEMORAL CANAL (MISCELLANEOUS) IMPLANT
COVER SURGICAL LIGHT HANDLE (MISCELLANEOUS) ×2 IMPLANT
CUP PINN GRIPTON 54 100 (Cup) ×1 IMPLANT
DRAPE C-ARM 42X72 X-RAY (DRAPES) IMPLANT
DRAPE ORTHO SPLIT 77X108 STRL (DRAPES) ×2
DRAPE PROXIMA HALF (DRAPES) ×2 IMPLANT
DRAPE SURG ORHT 6 SPLT 77X108 (DRAPES) ×1 IMPLANT
DRAPE U-SHAPE 47X51 STRL (DRAPES) ×2 IMPLANT
DRILL BIT 7/64X5 (BIT) ×2 IMPLANT
DRSG AQUACEL AG ADV 3.5X14 (GAUZE/BANDAGES/DRESSINGS) ×1 IMPLANT
DURAPREP 26ML APPLICATOR (WOUND CARE) ×2 IMPLANT
ELECT BLADE 4.0 EZ CLEAN MEGAD (MISCELLANEOUS)
ELECT BLADE 6.5 EXT (BLADE) ×1 IMPLANT
ELECT REM PT RETURN 9FT ADLT (ELECTROSURGICAL) ×2
ELECTRODE BLDE 4.0 EZ CLN MEGD (MISCELLANEOUS) IMPLANT
ELECTRODE REM PT RTRN 9FT ADLT (ELECTROSURGICAL) ×1 IMPLANT
ELIMINATOR HOLE APEX DEPUY (Hips) ×1 IMPLANT
EVACUATOR 1/8 PVC DRAIN (DRAIN) IMPLANT
GAUZE SPONGE 4X4 12PLY STRL (GAUZE/BANDAGES/DRESSINGS) ×2 IMPLANT
GAUZE XEROFORM 5X9 LF (GAUZE/BANDAGES/DRESSINGS) ×2 IMPLANT
GLOVE BIO SURGEON STRL SZ7.5 (GLOVE) ×2 IMPLANT
GLOVE BIO SURGEON STRL SZ8.5 (GLOVE) ×4 IMPLANT
GLOVE BIOGEL PI IND STRL 8 (GLOVE) ×2 IMPLANT
GLOVE BIOGEL PI IND STRL 9 (GLOVE) ×1 IMPLANT
GLOVE BIOGEL PI INDICATOR 8 (GLOVE) ×2
GLOVE BIOGEL PI INDICATOR 9 (GLOVE) ×1
GOWN STRL REUS W/ TWL LRG LVL3 (GOWN DISPOSABLE) ×2 IMPLANT
GOWN STRL REUS W/ TWL XL LVL3 (GOWN DISPOSABLE) ×5 IMPLANT
GOWN STRL REUS W/TWL LRG LVL3 (GOWN DISPOSABLE) ×4
GOWN STRL REUS W/TWL XL LVL3 (GOWN DISPOSABLE) ×10
HANDPIECE INTERPULSE COAX TIP (DISPOSABLE)
HEAD FEM DLT TS CER 36X+0 (Hips) ×1 IMPLANT
HEEL PROTECTOR  874200 (MISCELLANEOUS)
HEEL PROTECTOR 874200 (MISCELLANEOUS) IMPLANT
HOOD PEEL AWAY FACE SHEILD DIS (HOOD) ×4 IMPLANT
KIT BASIN OR (CUSTOM PROCEDURE TRAY) ×2 IMPLANT
KIT ROOM TURNOVER OR (KITS) ×2 IMPLANT
LINER MARATHON 10D 36X54 (Hips) IMPLANT
LINER MARATHON 10DEG 36X54 (Hips) ×2 IMPLANT
MANIFOLD NEPTUNE II (INSTRUMENTS) ×2 IMPLANT
NEEDLE 22X1 1/2 (OR ONLY) (NEEDLE) ×2 IMPLANT
NOZZLE PRISM 8.5MM (MISCELLANEOUS) IMPLANT
NS IRRIG 1000ML POUR BTL (IV SOLUTION) ×4 IMPLANT
PACK TOTAL JOINT (CUSTOM PROCEDURE TRAY) ×2 IMPLANT
PAD ARMBOARD 7.5X6 YLW CONV (MISCELLANEOUS) ×4 IMPLANT
PASSER SUT SWANSON 36MM LOOP (INSTRUMENTS) IMPLANT
PRESSURIZER FEMORAL UNIV (MISCELLANEOUS) IMPLANT
SET HNDPC FAN SPRY TIP SCT (DISPOSABLE) IMPLANT
SLEEVE SURGEON STRL (DRAPES) IMPLANT
SPONGE LAP 18X18 X RAY DECT (DISPOSABLE) IMPLANT
STAPLER VISISTAT 35W (STAPLE) ×2 IMPLANT
STEM FEM MOD STD 42 15X20X165 (Hips) ×1 IMPLANT
SUT ETHIBOND 2 V 37 (SUTURE) ×2 IMPLANT
SUT VIC AB 0 CTB1 27 (SUTURE) ×2 IMPLANT
SUT VIC AB 1 CTX 36 (SUTURE) ×2
SUT VIC AB 1 CTX36XBRD ANBCTR (SUTURE) ×1 IMPLANT
SUT VIC AB 2-0 CTB1 (SUTURE) ×2 IMPLANT
SUT VIC AB 3-0 CT1 27 (SUTURE) ×2
SUT VIC AB 3-0 CT1 TAPERPNT 27 (SUTURE) ×1 IMPLANT
SUT VIC AB 3-0 FS2 27 (SUTURE) ×1 IMPLANT
SYR 20ML ECCENTRIC (SYRINGE) ×2 IMPLANT
SYR CONTROL 10ML LL (SYRINGE) ×2 IMPLANT
TOWEL OR 17X24 6PK STRL BLUE (TOWEL DISPOSABLE) ×2 IMPLANT
TOWEL OR 17X26 10 PK STRL BLUE (TOWEL DISPOSABLE) ×2 IMPLANT
TOWER CARTRIDGE SMART MIX (DISPOSABLE) IMPLANT
TRAY FOLEY CATH 14FR (SET/KITS/TRAYS/PACK) IMPLANT
TUBE ANAEROBIC SPECIMEN COL (MISCELLANEOUS) ×2 IMPLANT
WATER STERILE IRR 1000ML POUR (IV SOLUTION) ×6 IMPLANT

## 2014-05-03 NOTE — Anesthesia Postprocedure Evaluation (Signed)
  Anesthesia Post-op Note  Patient: Patrick Salas  Procedure(s) Performed: Procedure(s): TOTAL HIP REVISION (Right)  Patient Location: PACU  Anesthesia Type:General  Level of Consciousness: awake  Airway and Oxygen Therapy: Patient Spontanous Breathing  Post-op Pain: mild  Post-op Assessment: Post-op Vital signs reviewed  Post-op Vital Signs: Reviewed  Last Vitals:  Filed Vitals:   05/03/14 1530  BP:   Pulse: 102  Temp:   Resp: 17    Complications: No apparent anesthesia complications

## 2014-05-03 NOTE — Op Note (Signed)
Preop diagnosis: Painful ASR on SROM R total hip  Postoperative diagnosis: Same  Procedure: Revision right total hip arthroplasty with removal of ASR cup and femoral head and revision to a 54 mm Gryption cup 10 polyethylene liner index posterior and superior and a +0 36 mm ceramic head, new 20x15x42x160 SROM stem.  Surgeon: Feliberto GottronFrank J. Turner Danielsowan M.D.  Assistant: Tomi LikensEric K. Gaylene BrooksPhillips PA-C  (present throughout entire procedure and necessary for timely completion of the procedure)  Estimated blood loss: 400 cc  Fluid replacement: 1800 cc of crystalloid  Complications: None  Indications: Patient with an ASR on S-ROM total hip that did very well until a few months ago when he had increasing groin pain. The pain wakes him up at night and recently got to the point where he could no longer go walking on a regular basis. MRI scan showed fluid collection, but no bony destruction. Plain x-rays show no change in the position of the components and the stem appears to be well ingrown. Risks and benefits of revision surgery have been discussed and questions answered.  Procedure: Patient was identified by arm band receive preoperative IV antibiotics in the holding area at, and hospital. He was then taken to the operating room where the appropriate anesthetic monitors were attached and general endotracheal anesthesia induced with the patient in the supine position. He was then rolled into the L lateral decubitus position and fixed there with a mark 2 pelvic clamp. The R lower extremity was prepped and draped in usual sterile fashion from the ankle to the hemipelvis. Time out procedure performed.  We began the operation by recreating the old posterior lateral incision 18 cm in line through the skin and subcutaneous tissue down to the level of the IT band which was cut in line with the skin incision. The pseudocapsule had a normal appearance and was taken down off the posterior intertrochanteric crest. We then remove scar  tissue from around to the ASR cup and femoral stem trunnion, dislocated a total hip and removed the ASR head with a mallet and metal cylinder. The flat screwdriver was placed between the stem and the sleeve and we were able to break the seal between the stem and the sleeve and extract the stem without much difficulty. We continued to remove scar tissue from around the acetabular component. A posterior inferior wing retractor was hammered into place. And we began loosening the cup by placing a 1/4 inch osteotome between the edge of the acetabular component and the bone. We then used the short Innomed curved osteotome around the edge of the cup and worked our way around the component. We then used to the 52 mm long curved osteotome and at that point it came out relatively easily indicating minimal ingrowth. Very little bone was noticed on the ingrowth surface of the cup and fibrous tissue is then stripped from the acetabulum. We reamed up to a 53 mm basket reamer obtaining good coverage in all quadrants irrigated with normal saline solution. We then hammered into place a 54 mm Gryption cup in 45 of abduction and 20 of anteversion. A central occluder was screwed into place followed by a 10 polyethylene liner index posterior and superior. At this point we then placed reamers and the femoral shaft up to 15.5 mm and irrigated the shaft of the femur. A new 20 x 15 x 42 x 1 60 S-ROM stem was then hammered into place in 20 of anteversion. A trial reduction was then performed with a +  0 36 mm femoral head instability was noted to 90 of flexion 60 of internal rotation and in full extension the hip could not be dislocated with external rotation. At this point a real +0 36 mm ceramic head was hammered into place, the hip reduced and irrigated with normal saline solution. The capsular flap was repaired back to the intertrochanteric crest through drill holes with #2 Ethibond suture. We then closed the IT band with running #1  Vicryl suture, the subcutaneous tissue with 0 and 2-0 undyed Vicryl suture, the skin was closed with running interlocking 3-0 nylon suture. A dressing of Mepilex was then applied, the patient was unclamped a rolled supine awakened extubated and taken to the recovery without difficulty.

## 2014-05-03 NOTE — Transfer of Care (Signed)
Immediate Anesthesia Transfer of Care Note  Patient: Patrick Salas  Procedure(s) Performed: Procedure(s): TOTAL HIP REVISION (Right)  Patient Location: PACU  Anesthesia Type:General  Level of Consciousness: awake and alert   Airway & Oxygen Therapy: Patient Spontanous Breathing and Patient connected to face mask oxygen  Post-op Assessment: Report given to PACU RN, Post -op Vital signs reviewed and stable and Patient moving all extremities  Post vital signs: Reviewed and stable  Complications: No apparent anesthesia complications

## 2014-05-03 NOTE — Interval H&P Note (Signed)
History and Physical Interval Note:  05/03/2014 12:45 PM  Patrick Salas  has presented today for surgery, with the diagnosis of PAINFUL RIGHT ASR HIP  The various methods of treatment have been discussed with the patient and family. After consideration of risks, benefits and other options for treatment, the patient has consented to  Procedure(s): TOTAL HIP REVISION (Right) as a surgical intervention .  The patient's history has been reviewed, patient examined, no change in status, stable for surgery.  I have reviewed the patient's chart and labs.  Questions were answered to the patient's satisfaction.     Nestor LewandowskyOWAN,Devante Capano J

## 2014-05-03 NOTE — OR Nursing (Signed)
Pt experiencing persistant episodes of sleep apnea. Have consulted Dr. Noreene LarssonJoslin and Dr. Chaney MallingHodierne at bs to observe. Orders recvd to admit to stepdown for closer observation.

## 2014-05-03 NOTE — Anesthesia Preprocedure Evaluation (Addendum)
Anesthesia Evaluation  Patient identified by MRN, date of birth, ID band Patient awake    Reviewed: Allergy & Precautions, H&P , NPO status , Patient's Chart, lab work & pertinent test results, reviewed documented beta blocker date and time   History of Anesthesia Complications (+) PONV and history of anesthetic complications  Airway Mallampati: II TM Distance: >3 FB Neck ROM: full    Dental  (+) Teeth Intact, Dental Advidsory Given   Pulmonary  breath sounds clear to auscultation        Cardiovascular hypertension, Rhythm:regular     Neuro/Psych    GI/Hepatic negative GI ROS, Neg liver ROS,   Endo/Other  Morbid obesity  Renal/GU negative Renal ROS     Musculoskeletal   Abdominal   Peds  Hematology   Anesthesia Other Findings   Reproductive/Obstetrics                          Anesthesia Physical Anesthesia Plan  ASA: III  Anesthesia Plan: General   Post-op Pain Management:    Induction: Intravenous  Airway Management Planned: Oral ETT  Additional Equipment:   Intra-op Plan:   Post-operative Plan: Extubation in OR  Informed Consent: I have reviewed the patients History and Physical, chart, labs and discussed the procedure including the risks, benefits and alternatives for the proposed anesthesia with the patient or authorized representative who has indicated his/her understanding and acceptance.   Dental advisory given  Plan Discussed with: CRNA and Anesthesiologist  Anesthesia Plan Comments:         Anesthesia Quick Evaluation

## 2014-05-03 NOTE — Discharge Instructions (Signed)
Total Hip Replacement, Care After °Refer to this sheet in the next few weeks. These instructions provide you with information on caring for yourself after your procedure. Your health care provider may also give you specific instructions. Your treatment has been planned according to the most current medical practices, but problems sometimes occur. Call your health care provider if you have any problems or questions after your procedure. °HOME CARE INSTRUCTIONS  °Your health care provider will give you specific precautions for certain types of movement. Additional instructions include: °· Take medicines only as directed by your health care provider. °· Take quick showers (3-5 min) rather than bathe until your health care provider tells you that you can take baths again. °· Avoid lifting until your health care provider instructs you otherwise. °· Use a raised toilet seat and avoid sitting in low chairs as instructed by your health care provider. °· Use crutches or a walker as instructed by your health care provider. °SEEK MEDICAL CARE IF: °· You have difficulty breathing. °· You have drainage, redness, or swelling at your incision site. °· You have a bad smell coming from your incision site. °· You have persistent bleeding from your incision site. °· Your incision breaks open after sutures (stitches) or staples have been removed. °· You have a fever. °SEEK IMMEDIATE MEDICAL CARE IF:  °· You have a rash. °· You have pain or swelling in your calf or thigh. °· You have shortness of breath or chest pain. °MAKE SURE YOU: °· Understand these instructions. °· Will watch your condition. °· Will get help if you are not doing well or get worse. °Document Released: 03/28/2005 Document Revised: 01/23/2014 Document Reviewed: 11/09/2013 °[employer]® Patient Information ©2015 [employer]. This information is not intended to replace advice given to you by your health care provider. Make sure you discuss any questions you have with  your health care provider. ° °

## 2014-05-04 ENCOUNTER — Encounter (HOSPITAL_COMMUNITY): Payer: Self-pay | Admitting: Orthopedic Surgery

## 2014-05-04 LAB — BASIC METABOLIC PANEL
Anion gap: 14 (ref 5–15)
BUN: 17 mg/dL (ref 6–23)
CO2: 23 mEq/L (ref 19–32)
Calcium: 8.3 mg/dL — ABNORMAL LOW (ref 8.4–10.5)
Chloride: 99 mEq/L (ref 96–112)
Creatinine, Ser: 1.34 mg/dL (ref 0.50–1.35)
GFR calc Af Amer: 72 mL/min — ABNORMAL LOW (ref >90)
GFR, EST NON AFRICAN AMERICAN: 62 mL/min — AB (ref >90)
Glucose, Bld: 123 mg/dL — ABNORMAL HIGH (ref 70–99)
POTASSIUM: 3.8 meq/L (ref 3.7–5.3)
SODIUM: 136 meq/L — AB (ref 137–147)

## 2014-05-04 LAB — CBC
HCT: 30 % — ABNORMAL LOW (ref 39.0–52.0)
HEMOGLOBIN: 9.9 g/dL — AB (ref 13.0–17.0)
MCH: 26.7 pg (ref 26.0–34.0)
MCHC: 33 g/dL (ref 30.0–36.0)
MCV: 80.9 fL (ref 78.0–100.0)
Platelets: 179 10*3/uL (ref 150–400)
RBC: 3.71 MIL/uL — ABNORMAL LOW (ref 4.22–5.81)
RDW: 14 % (ref 11.5–15.5)
WBC: 7.5 10*3/uL (ref 4.0–10.5)

## 2014-05-04 NOTE — Plan of Care (Signed)
Problem: Phase I Progression Outcomes Goal: Post op pain controlled with appropriate interventions Outcome: Progressing Patient experiencing some pain to right hip. Upon arrival to unit, patient has having pain. After informing patient of available pain medications, he stated that Vicodin works well for him and Oxycodone does not. Called physician, ordered vicodin and d/c'd oxycodone. Given 1 dose of dilaudid for 7 out of 10 pain shortly after arrival to unit from PACU upon waiting for vicodin order. 15 minutes after administration, patient called RN c/o itching to left forearm, where small rash was present. Benadryl administered. Rash was limited to forearm, itching resolved after benadryl administration.  Vicodin administered for pain. Controlling pain. No adverse effects from medication.  Goal: Post op hemodynamically stable Outcome: Progressing Vitals stable. NSR on the monitor. BP within range. Afebrile. Goal: Post op CMS/Neurovascular status WDL Outcome: Progressing Neurovascular status within normal limits. Right leg has normal sensation to light touch. Denies numbness. Pulses palpable distal to surgical area. Cap refill <3 seconds. Able to move extremity with some limited movement after surgery. Able to help turn and redistributes weight and turns self. Ice applied to right hip, which patient states helps some. Dressing is c/d/i. Goal: Post op clear liquids, advance diet as tolerated Outcome: Progressing Patient is taking clear liquids this evening. No nausea or vomiting. Patient states he is hungry. Applesauce and graham crackers given. Patient able to tolerate solid foods. Will advance to regular diet in am.

## 2014-05-04 NOTE — Evaluation (Signed)
Physical Therapy Evaluation Patient Details Name: Patrick Salas MRN: [id number] DOB: 02/19/1966 Today's Date: 05/04/2014   History of Present Illness  Pt admitted for hardware replacement of R THA due to recalled hardware and pain  Clinical Impression  Pt very pleasant gentleman moving well POD#1. Pt familiar with posterior hip precautions from prior bil THA and educated pt again for posterior precautions with transfers, gait, and function. Pt educated for HEP as well and encouraged to perform along with gait daily. Will follow acutely to maximize mobility, function, strength and gait to return pt to PLOF.     Follow Up Recommendations Outpatient PT    Equipment Recommendations  None recommended by PT    Recommendations for Other Services       Precautions / Restrictions Precautions Precautions: Posterior Hip Precaution Comments: posterior hip handout Restrictions Weight Bearing Restrictions: Yes RLE Weight Bearing: Weight bearing as tolerated Other Position/Activity Restrictions: WBAT      Mobility  Bed Mobility Overal bed mobility: Needs Assistance Bed Mobility: Supine to Sit     Supine to sit: Min assist     General bed mobility comments: min assist for RLE only to slide to EOB  Transfers Overall transfer level: Needs assistance   Transfers: Sit to/from Stand Sit to Stand: Supervision         General transfer comment: cues for hand and RLE placement  Ambulation/Gait Ambulation/Gait assistance: Supervision Ambulation Distance (Feet): 300 Feet Assistive device: Rolling walker (2 wheeled) Gait Pattern/deviations: Step-through pattern;Decreased stride length   Gait velocity interpretation: Below normal speed for age/gender    Stairs            Wheelchair Mobility    Modified Rankin (Stroke Patients Only)       Balance                                             Pertinent Vitals/Pain Pain Assessment: No/denies pain     Home Living Family/patient expects to be discharged to:: Private residence Living Arrangements: Children Available Help at Discharge: Family Type of Home: House Home Access: Stairs to enter Entrance Stairs-Rails: None Entrance Stairs-Number of Steps: 4 Home Layout: One level Home Equipment: Walker - standard;Bedside commode;Shower seat;Crutches;Cane - single point      Prior Function Level of Independence: Independent               Hand Dominance   Dominant Hand: Right    Extremity/Trunk Assessment   Upper Extremity Assessment: Overall WFL for tasks assessed           Lower Extremity Assessment: RLE deficits/detail;LLE deficits/detail RLE Deficits / Details: limited by post op discomfort with mobility and precautions LLE Deficits / Details: WFL  Cervical / Trunk Assessment: Normal  Communication   Communication: No difficulties  Cognition Arousal/Alertness: Awake/alert Behavior During Therapy: WFL for tasks assessed/performed Overall Cognitive Status: Within Functional Limits for tasks assessed                      General Comments      Exercises Total Joint Exercises Ankle Circles/Pumps: Both;10 reps;Seated;AROM Hip ABduction/ADduction: AAROM;Right;10 reps;Seated Long Arc Quad: AROM;Right;10 reps Knee Flexion: AROM;Right;Seated (only to 90degrees)      Assessment/Plan    PT Assessment Patient needs continued PT services  PT Diagnosis Difficulty walking   PT Problem List Decreased activity tolerance;Decreased mobility  PT Treatment Interventions DME instruction;Gait training;Functional mobility training;Therapeutic activities;Therapeutic exercise;Stair training;Patient/family education   PT Goals (Current goals can be found in the Care Plan section) Acute Rehab PT Goals Patient Stated Goal: be able to play at the park with my dgtr Patrick Salas, 48yo) PT Goal Formulation: With patient Time For Goal Achievement: 05/11/14 Potential to Achieve  Goals: Good    Frequency 7X/week   Barriers to discharge        Co-evaluation PT/OT/SLP Co-Evaluation/Treatment: Yes Reason for Co-Treatment: Other (comment) (in SDU after THA eval and co-tx due to expected limited function and need for safety) PT goals addressed during session: Mobility/safety with mobility;Proper use of DME;Strengthening/ROM         End of Session   Activity Tolerance: Patient tolerated treatment well Patient left: in chair;with call bell/phone within reach;with family/visitor present Nurse Communication: Mobility status         Time: 1610-9604 PT Time Calculation (min): 25 min   Charges:   PT Evaluation $Initial PT Evaluation Tier I: 1 Procedure PT Treatments $Therapeutic Activity: 8-22 mins   PT G CodesDelorse Lek 05/04/2014, 3:02 PM Delaney Meigs, PT [phone]

## 2014-05-04 NOTE — Evaluation (Signed)
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 All education has been completed and the patient has no further questions. See below for any follow-up Occupational Therapy or equipment needs. OT to sign off. Thank you for referral.      Follow Up Recommendations  No OT follow up    Equipment Recommendations  None recommended by OT    Recommendations for Other Services       Precautions / Restrictions Precautions Precautions: Posterior Hip Precaution Comments: posterior hip handout Restrictions Weight Bearing Restrictions: Yes RLE Weight Bearing: Weight bearing as tolerated Other Position/Activity Restrictions: WBAT      Mobility Bed Mobility Overal bed mobility: Needs Assistance Bed Mobility: Supine to Sit     Supine to sit: Min assist     General bed mobility comments: min assist for RLE only to slide to EOB  Transfers Overall transfer level: Needs assistance Equipment used: Rolling walker (2 wheeled) Transfers: Sit to/from Stand Sit to Stand: Supervision         General transfer comment: cues for hand and RLE placement    Balance                                            ADL Overall ADL's : At baseline                                             Vision                     Perception     Praxis      Pertinent Vitals/Pain Pain Assessment: No/denies pain     Hand Dominance Right   Extremity/Trunk Assessment Upper Extremity Assessment Upper Extremity Assessment: Overall WFL for tasks assessed   Lower Extremity Assessment Lower Extremity Assessment: Defer to PT evaluation   Cervical /  Trunk Assessment Cervical / Trunk Assessment: Normal   Communication Communication Communication: No difficulties   Cognition Arousal/Alertness: Awake/alert Behavior During Therapy: WFL for tasks assessed/performed Overall Cognitive Status: Within Functional Limits for tasks assessed                     General Comments       Exercises       Shoulder Instructions      Home Living Family/patient expects to be discharged to:: Private residence Living Arrangements: Children;Other relatives Available Help at Discharge: Family Type of Home: House Home Access: Stairs to enter Entergy CorporationEntrance Stairs-Number of Steps: 4 Entrance Stairs-Rails: None Home Layout: One level     Bathroom Shower/Tub: Producer, television/film/videoWalk-in shower   Bathroom Toilet: Standard     Home Equipment: Environmental consultantWalker - standard;Bedside commode;Shower seat;Crutches;Cane - single point          Prior Functioning/Environment Level of Independence: Independent             OT Diagnosis:     OT Problem List:     OT Treatment/Interventions:      OT Goals(Current goals  can be found in the care plan section) Acute Rehab OT Goals Patient Stated Goal: be able to play at the park with my dgtr [name], 48yo)  OT Frequency:     Barriers to D/C:            Co-evaluation PT/OT/SLP Co-Evaluation/Treatment: Yes Reason for Co-Treatment: For patient/therapist safety   OT goals addressed during session: ADL's and self-care      End of Session Equipment Utilized During Treatment: Gait belt;Rolling walker Nurse Communication: Precautions  Activity Tolerance: Patient tolerated treatment well Patient left: in chair;with call bell/phone within reach;with family/visitor present   Time: 0981-1914 OT Time Calculation (min): 23 min Charges:  OT General Charges $OT Visit: 1 Procedure OT Evaluation $Initial OT Evaluation Tier I: 1 Procedure OT Treatments $Self Care/Home Management : 8-22 mins G-Codes:    Harolyn Rutherford May 22, 2014

## 2014-05-04 NOTE — Care Management Note (Signed)
    Page 1 of 2   05/04/2014     1:58:40 PM CARE MANAGEMENT NOTE 05/04/2014  Patient:  Patrick Salas,Patrick Salas   Account Number:  [id number]  Date Initiated:  05/04/2014  Documentation initiated by:  Donn PieriniWEBSTER,Zadkiel Dragan  Subjective/Objective Assessment:   Pt admitted s/p total hip revision     Action/Plan:   PTA pt lived at home   Anticipated DC Date:  05/06/2014   Anticipated DC Plan:  HOME W HOME HEALTH SERVICES      DC Planning Services  CM consult      [facility]  DURABLE MEDICAL EQUIPMENT   Choice offered to / List presented to:  C-1 Patient        HH arranged  HH-1 RN  HH-2 PT  HH-3 OT      [facility]   Status of service:  In process, will continue to follow Medicare Important Message given?   (If response is "NO", the following Medicare IM given date fields will be blank) Date Medicare IM given:   Medicare IM given by:   Date Additional Medicare IM given:   Additional Medicare IM given by:    Discharge Disposition:    Per UR Regulation:  Reviewed for med. necessity/level of care/duration of stay  If discussed at Long Length of Stay Meetings, dates discussed:    Comments:  05/04/14- 1145- Donn PieriniKristi Freeda Spivey RN, BSN [phone] [facility]- in to speak with pt at bedside- per conversation pt states that he already has needed DME at home that includes- walker, [facility]- he states that he has had HH in past and wants to use the same agency that he used back in February- [facility]- [facility]/PT- services will begin within 24-48 post discharge. - pt declines any DME.

## 2014-05-04 NOTE — Progress Notes (Signed)
Patient ID: Patrick Salas, male   DOB: 09/02/1966, 48 y.o.   MRN: [id number] PATIENT ID: Patrick Hanimothy D Birman  MRN: [id number]  DOB/AGE:  46/08/1966 / 48 y.o.  1 Day Post-Op Procedure(s) (LRB): TOTAL HIP REVISION (Right)    PROGRESS NOTE Subjective: Patient is alert, oriented, x1 yesterday Nausea, no Vomiting, yes passing gas, no Bowel Movement. Taking PO well. Denies SOB, Chest or Calf Pain. Using Incentive Spirometer, PAS in place. Ambulate weight bearing as tolerated with physical therapy today Patient reports pain as 2 on 0-10 scale. Patient was observed with periods of sleep apnea in the PACU yesterday and has been watched overnight in the step down unit on 3 S. nurse reports a normal O2 saturation levels. Normal breathing pattern and no arrhythmias.  Objective: Vital signs in last 24 hours: Filed Vitals:   05/04/14 0401 05/04/14 0500 05/04/14 0600 05/04/14 0740  BP:   132/77 123/73  Pulse:  87 104   Temp: 97.8 F (36.6 C)   98.1 F (36.7 C)  TempSrc: Oral   Oral  Resp:  25 22   Height:      Weight:      SpO2:  90% 100%       Intake/Output from previous day: I/O last 3 completed shifts: In: 3381.3 [P.O.:1415; I.V.:1856.3; IV Piggyback:110] Out: 2875 [Urine:2475; Blood:400]   Intake/Output this shift:     LABORATORY DATA:  Recent Labs  05/04/14 0410  WBC 7.5  HGB 9.9*  HCT 30.0*  PLT 179  NA 136*  K 3.8  CL 99  CO2 23  BUN 17  CREATININE 1.34  GLUCOSE 123*  CALCIUM 8.3*    Examination: Neurologically intact ABD soft Neurovascular intact Sensation intact distally Intact pulses distally Dorsiflexion/Plantar flexion intact Incision: moderate drainage No cellulitis present Compartment soft} XR AP&Lat of hip shows well placed\fixed THA, a new dressing was applied in the step down unit  Assessment:   1 Day Post-Op Procedure(s) (LRB): TOTAL HIP REVISION (Right) ADDITIONAL DIAGNOSIS:  Expected Acute Blood Loss Anemia, Sleep Apnea, transient may have  been related to Dilaudid Plan: PT/OT WBAT, THA  posterior precautions, will transfer to orthopedic floor today  DVT Prophylaxis: SCDx72 hrs, ASA 325 mg BID x 2 weeks  DISCHARGE PLAN: Home  DISCHARGE NEEDS: HHPT, HHRN, CPM and Walker

## 2014-05-04 NOTE — Progress Notes (Signed)
Utilization review completed.  

## 2014-05-05 LAB — CBC
HCT: 28.2 % — ABNORMAL LOW (ref 39.0–52.0)
Hemoglobin: 9.4 g/dL — ABNORMAL LOW (ref 13.0–17.0)
MCH: 27.1 pg (ref 26.0–34.0)
MCHC: 33.3 g/dL (ref 30.0–36.0)
MCV: 81.3 fL (ref 78.0–100.0)
PLATELETS: 177 10*3/uL (ref 150–400)
RBC: 3.47 MIL/uL — AB (ref 4.22–5.81)
RDW: 14.1 % (ref 11.5–15.5)
WBC: 7.2 10*3/uL (ref 4.0–10.5)

## 2014-05-05 NOTE — Progress Notes (Signed)
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 Did not want to practice with crutches this session and stated that he would use walker at home for a while. Able to complete long hall ambulation, stairs and HEP without difficulty. Planning to DC later today  Follow Up Recommendations  Outpatient PT     Equipment Recommendations  None recommended by PT    Recommendations for Other Services       Precautions / Restrictions Precautions Precautions: Posterior Hip Precaution Comments: Patient able to recall all precautions Restrictions RLE Weight Bearing: Weight bearing as tolerated    Mobility  Bed Mobility         Supine to sit: Supervision     General bed mobility comments: cues for positioning and not to cross leg past midline  Transfers Overall transfer level: Needs assistance Equipment used: Rolling walker (2 wheeled)   Sit to Stand: Supervision         General transfer comment: cues for hand and RLE placement  Ambulation/Gait Ambulation/Gait assistance: Supervision Ambulation Distance (Feet): 600 Feet Assistive device: Rolling walker (2 wheeled) Gait Pattern/deviations: Step-through pattern;Decreased stride length   Gait velocity interpretation: Below normal speed for age/gender     Stairs Stairs: Yes Stairs assistance: Supervision Stair Management: Step to pattern;Forwards;Two rails Number of Stairs: 5 General stair comments: Patient able to recall technique  Wheelchair Mobility    Modified Rankin (Stroke Patients Only)       Balance                                    Cognition Arousal/Alertness: Awake/alert Behavior During Therapy: WFL for tasks  assessed/performed Overall Cognitive Status: Within Functional Limits for tasks assessed                      Exercises Total Joint Exercises Quad Sets: Both;AROM;10 reps Gluteal Sets: AROM;Both;10 reps Heel Slides: AAROM;Right;10 reps Hip ABduction/ADduction: AAROM;Right;10 reps;Seated Long Arc Quad: AROM;Right;10 reps    General Comments        Pertinent Vitals/Pain Pain Assessment: No/denies pain    Home Living                      Prior Function            PT Goals (current goals can now be found in the care plan section) Progress towards PT goals: Progressing toward goals    Frequency  7X/week    PT Plan Current plan remains appropriate    Co-evaluation             End of Session Equipment Utilized During Treatment: Gait belt Activity Tolerance: Patient tolerated treatment well Patient left: in chair;with call bell/phone within reach     Time: 0911-0936 PT Time Calculation (min): 25 min  Charges:  $Gait Training: 8-22 mins $Therapeutic Exercise: 8-22 mins                    G Codes:      Fredrich BirksRobinette, Saoirse Legere Elizabeth 05/05/2014, 1:35 PM  05/05/2014 Fredrich Birksobinette, Deshane Cotroneo Elizabeth PTA [phone] pager [phone] office

## 2014-05-05 NOTE — Progress Notes (Signed)
[facility]  Patient Status: New  [facility] is providing the following services: RN, PT and OT  If patient discharges after hours, please call [phone].   Patrick [name] 05/05/2014, 11:15 AM

## 2014-05-05 NOTE — Progress Notes (Signed)
PATIENT ID: Patrick Salas  MRN: [id number]  DOB/AGE:  46-05-1966 / 48 y.o.  2 Days Post-Op Procedure(s) (LRB): TOTAL HIP REVISION (Right)    PROGRESS NOTE Subjective: Patient is alert, oriented, no Nausea, no Vomiting, yes passing gas, no Bowel Movement. Taking PO well. Denies SOB, Chest or Calf Pain. Using Incentive Spirometer, PAS in place. Ambulate WBAT with pt doing steps  and walking 300 ft yesterday Patient reports pain as mild  .    Objective: Vital signs in last 24 hours: Filed Vitals:   05/04/14 2111 05/05/14 0000 05/05/14 0326 05/05/14 0629  BP: 143/90   117/82  Pulse: 98   88  Temp: 98.5 F (36.9 C)   98.2 F (36.8 C)  TempSrc: Oral   Oral  Resp: 18 16 16 16   Height:      Weight:      SpO2: 99%   100%      Intake/Output from previous day: I/O last 3 completed shifts: In: 3591.3 [P.O.:2735; I.V.:856.3] Out: 2750 [Urine:2750]   Intake/Output this shift:     LABORATORY DATA:  Recent Labs  05/04/14 0410 05/05/14 0508  WBC 7.5 7.2  HGB 9.9* 9.4*  HCT 30.0* 28.2*  PLT 179 177  NA 136*  --   K 3.8  --   CL 99  --   CO2 23  --   BUN 17  --   CREATININE 1.34  --   GLUCOSE 123*  --   CALCIUM 8.3*  --     Examination: Neurologically intact Neurovascular intact Sensation intact distally Intact pulses distally Dorsiflexion/Plantar flexion intact Incision: scant drainage No cellulitis present Compartment soft} XR AP&Lat of hip shows well placed\fixed THA  Assessment:   2 Days Post-Op Procedure(s) (LRB): TOTAL HIP REVISION (Right) ADDITIONAL DIAGNOSIS:  Expected Acute Blood Loss Anemia, Sleep Apnea  Plan: PT/OT WBAT, THA  posterior precautions  DVT Prophylaxis: SCDx72 hrs, ASA 325 mg BID x 2 weeks  DISCHARGE PLAN: Home later today after therapy  DISCHARGE NEEDS: HHPT, HHRN, Walker and 3-in-1 comode seat

## 2014-05-05 NOTE — Discharge Summary (Signed)
Patient ID: Patrick Salas MRN: [id number] DOB/AGE: [birth year] 48 y.o.  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 Complication of anesthesia 2008    woke up during surgery- saw lights , did not hear anything.."maybe 2 sceonds".  Marland Kitchen PONV (postoperative nausea and vomiting)   . Family history of anesthesia complication     "whole family nausea"  . History of kidney stones   . Hemorrhoids   . Hypertension     takes Amlodipine and Losartan daily    Surgeries: Procedure(s): TOTAL HIP REVISION on 05/03/2014   Consultants:    Discharged Condition: Improved  Hospital Course: Patrick Salas is an 48 y.o. male who was admitted 05/03/2014 for operative treatment of<principal problem not specified>. Patient has severe unremitting pain that affects sleep, daily activities, and work/hobbies. After pre-op clearance the patient was taken to the operating room on 05/03/2014 and underwent  Procedure(s): TOTAL HIP REVISION.    Patient was given perioperative antibiotics: Anti-infectives   Start     Dose/Rate Route Frequency Ordered Stop   05/03/14 0600  ceFAZolin (ANCEF) IVPB 2 g/50 mL premix     2 g 100 mL/hr over 30 Minutes Intravenous On call to O.R. 05/02/14 1405 05/03/14 1322       Patient was given sequential compression devices, early ambulation, and chemoprophylaxis to prevent DVT.  Patient benefited maximally from hospital stay and there were no complications.    Recent vital signs: Patient Vitals for the past 24 hrs:  BP Temp Temp src Pulse Resp SpO2  05/05/14 0629 117/82 mmHg 98.2 F (36.8 C) Oral 88 16 100 %  05/05/14 0326 - - - - 16 -  05/05/14 0000 - - - - 16 -  05/04/14 2111 143/90 mmHg 98.5 F (36.9 C) Oral 98 18 99 %  05/04/14 2006 - 99.3 F (37.4 C) Axillary - - -  05/04/14 2005 119/86  mmHg - - 88 20 97 %  05/04/14 2000 - - - - 16 -  05/04/14 1600 - - - - 15 98 %  05/04/14 1500 - 97.8 F (36.6 C) Oral - - -  05/04/14 1455 118/76 mmHg - - 125 18 96 %  05/04/14 1300 106/67 mmHg - - 98 13 99 %  05/04/14 1257 - 97.9 F (36.6 C) Oral - - -  05/04/14 1138 - - - - 16 96 %     Recent laboratory studies:  Recent Labs  05/04/14 0410 05/05/14 0508  WBC 7.5 7.2  HGB 9.9* 9.4*  HCT 30.0* 28.2*  PLT 179 177  NA 136*  --   K 3.8  --   CL 99  --   CO2 23  --   BUN 17  --   CREATININE 1.34  --   GLUCOSE 123*  --   CALCIUM 8.3*  --      Discharge Medications:     Medication List         amLODipine 10 MG tablet  Commonly known as:  NORVASC  Take 10 mg by mouth daily.     aspirin EC 325 MG tablet  Take 1 tablet (325 mg total) by mouth 2 (two) times daily.     losartan 100 MG tablet  Commonly known as:  COZAAR  Take 100 mg by mouth daily.  methocarbamol 500 MG tablet  Commonly known as:  ROBAXIN  Take 1 tablet (500 mg total) by mouth 2 (two) times daily with a meal.     oxyCODONE-acetaminophen 5-325 MG per tablet  Commonly known as:  ROXICET  Take 1 tablet by mouth every 4 (four) hours as needed.        Diagnostic Studies: Dg Pelvis Portable  05/03/2014   CLINICAL DATA:  Postop right hip replacement surgery.  EXAM: PORTABLE PELVIS 1-2 VIEWS  COMPARISON:  11/16/2013.  FINDINGS: Bilateral hip prostheses appears stable. No complicating features are demonstrated. The pubic symphysis and SI joints are intact.  IMPRESSION: Stable appearance of both hip prosthesis.  No complicating features.   Electronically Signed   By: Loralie ChampagneMark  Gallerani M.D.   On: 05/03/2014 15:49    Disposition: 06-Home-Health Care Svc      Discharge Instructions   Call MD / Call 911    Complete by:  As directed   If you experience chest pain or shortness of breath, CALL 911 and be transported to the hospital emergency room.  If you develope a fever above 101 F, pus (white drainage) or  increased drainage or redness at the wound, or calf pain, call your surgeon's office.     Change dressing    Complete by:  As directed   You may change your dressing on day 5, then change the dressing daily with sterile 4 x 4 inch gauze dressing and paper tape.  You may clean the incision with alcohol prior to redressing     Constipation Prevention    Complete by:  As directed   Drink plenty of fluids.  Prune juice may be helpful.  You may use a stool softener, such as Colace (over the counter) 100 mg twice a day.  Use MiraLax (over the counter) for constipation as needed.     Diet - low sodium heart healthy    Complete by:  As directed      Discharge instructions    Complete by:  As directed   Follow up in office with Dr. Turner Danielsowan in 2 weeks.     Driving restrictions    Complete by:  As directed   No driving for 2 weeks     Follow the hip precautions as taught in Physical Therapy    Complete by:  As directed      Increase activity slowly as tolerated    Complete by:  As directed      Patient may shower    Complete by:  As directed   You may shower without a dressing once there is no drainage.  Do not wash over the wound.  If drainage remains, cover wound with plastic wrap and then shower.           Follow-up Information   Follow up with Nestor LewandowskyOWAN,FRANK J, MD In 2 weeks.   Specialty:  Orthopedic Surgery   Contact information:   [address] [place] [address] [phone]       Follow up with [facility]. (HH-RN/PT/OT arranged)    Contact information:   [address] [place] [address] [phone]       Follow up with Nestor LewandowskyOWAN,FRANK J, MD In 2 weeks.   Specialty:  Orthopedic Surgery   Contact information:   [address] [place] [address] [phone]        Signed: Henry RusselHILLIPS, Pantera Winterrowd R 05/05/2014, 10:57 AM

## 2014-07-13 ENCOUNTER — Other Ambulatory Visit: Payer: Self-pay | Admitting: Internal Medicine

## 2014-07-13 DIAGNOSIS — N183 Chronic kidney disease, stage 3 unspecified: Secondary | ICD-10-CM

## 2014-07-14 ENCOUNTER — Other Ambulatory Visit: Payer: Medicare Other

## 2014-11-14 ENCOUNTER — Other Ambulatory Visit: Payer: Self-pay | Admitting: Internal Medicine

## 2014-11-14 DIAGNOSIS — N183 Chronic kidney disease, stage 3 unspecified: Secondary | ICD-10-CM

## 2014-11-15 ENCOUNTER — Ambulatory Visit
Admission: RE | Admit: 2014-11-15 | Discharge: 2014-11-15 | Disposition: A | Discharge status: Home / Self Care | Payer: Medicare Other | Source: Ambulatory Visit | Attending: Internal Medicine | Admitting: Internal Medicine

## 2014-11-15 DIAGNOSIS — N183 Chronic kidney disease, stage 3 unspecified: Secondary | ICD-10-CM

## 2015-11-26 IMAGING — CR DG PORTABLE PELVIS
1 series · 1 of 1 positions shown · non-contrast
Comparison: MR HIP*L* W/O CM dated 12/18/2010

CLINICAL DATA: Left hip surgery.

EXAM:
PORTABLE PELVIS 1-2 VIEWS

[AP]
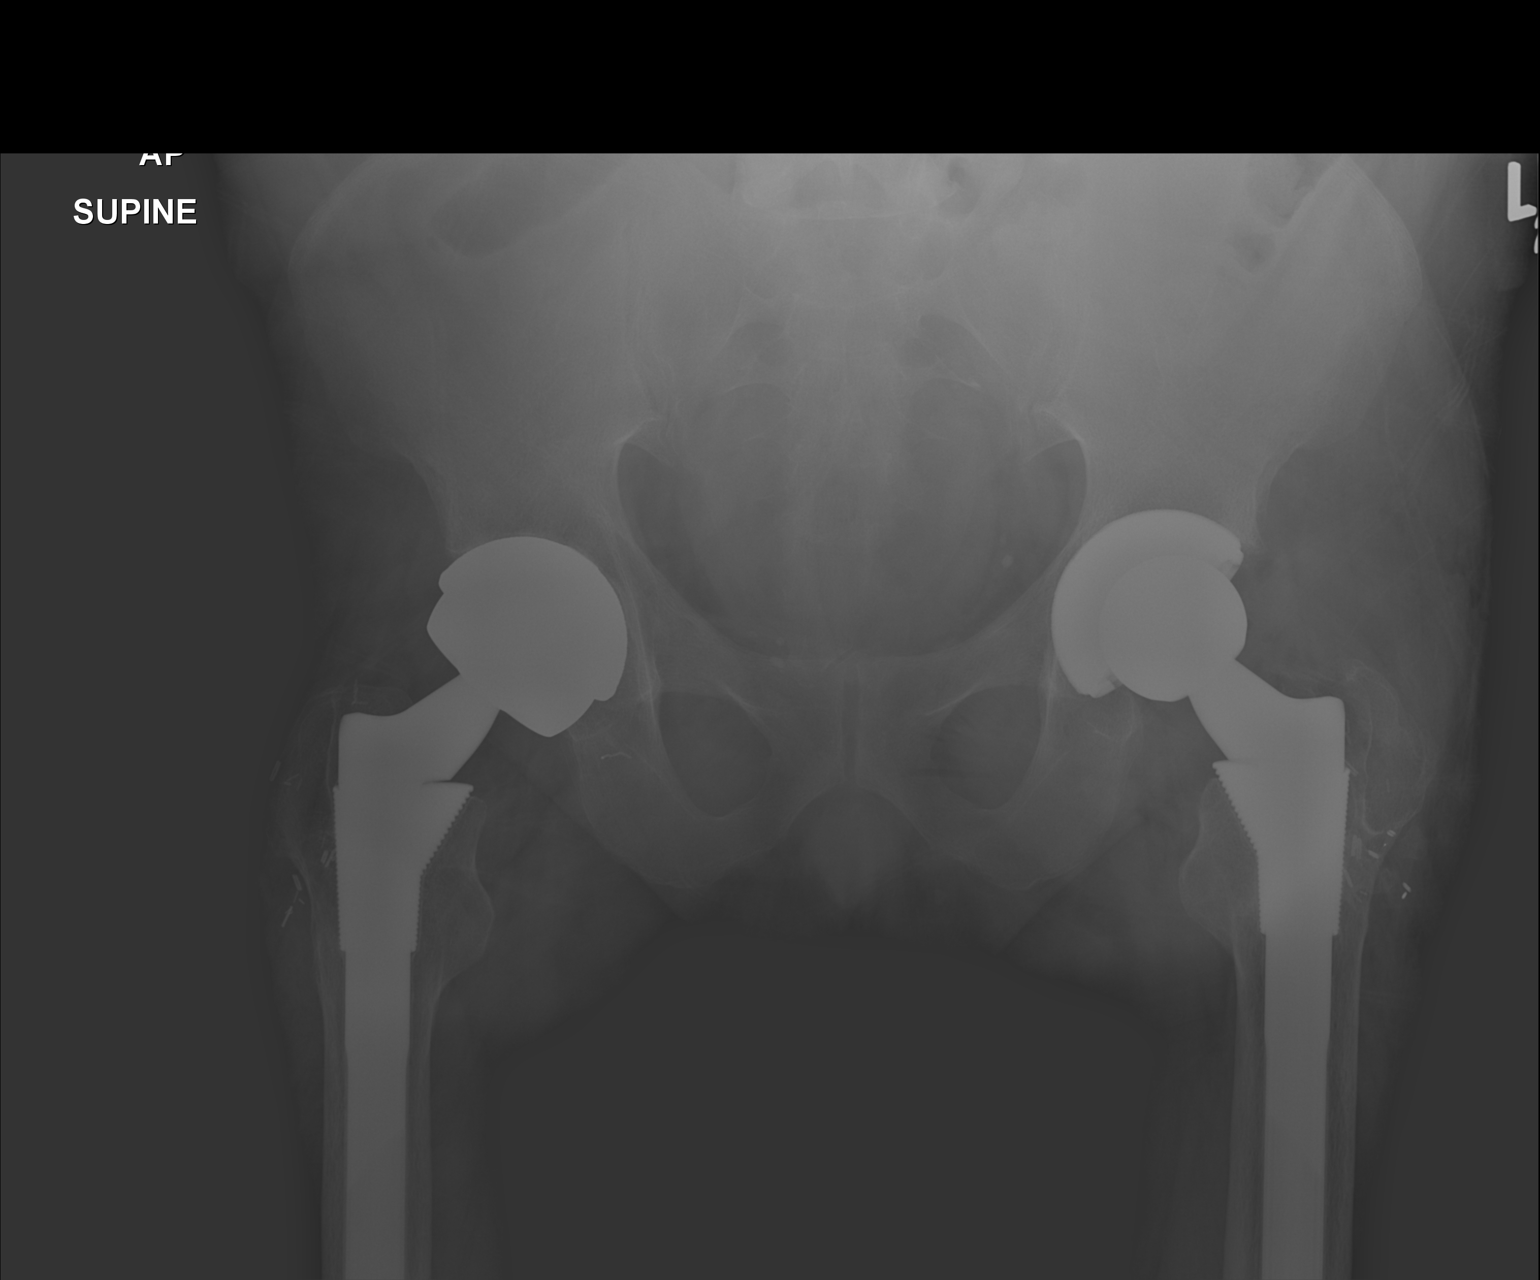

[1 of 1 positions shown; findings below may reference images not displayed]

FINDINGS: Patient has had bilateral hip replacements with good anatomic
alignment. No acute bony abnormality. Calcifications in pelvis
consistent with phleboliths.
IMPRESSION: No acute abnormality.  Patient has had bilateral hip replacements.

## 2016-08-12 ENCOUNTER — Emergency Department (HOSPITAL_COMMUNITY)
Admission: EM | Admit: 2016-08-12 | Discharge: 2016-08-12 | Disposition: A | Discharge status: Home / Self Care | Payer: Medicare Other | Attending: Emergency Medicine | Admitting: Emergency Medicine

## 2016-08-12 ENCOUNTER — Encounter (HOSPITAL_COMMUNITY): Payer: Self-pay | Admitting: Emergency Medicine

## 2016-08-12 DIAGNOSIS — T7840XA Allergy, unspecified, initial encounter: Secondary | ICD-10-CM

## 2016-08-12 DIAGNOSIS — L500 Allergic urticaria: Secondary | ICD-10-CM | POA: Insufficient documentation

## 2016-08-12 DIAGNOSIS — Z7982 Long term (current) use of aspirin: Secondary | ICD-10-CM | POA: Insufficient documentation

## 2016-08-12 DIAGNOSIS — Z79899 Other long term (current) drug therapy: Secondary | ICD-10-CM | POA: Insufficient documentation

## 2016-08-12 DIAGNOSIS — I10 Essential (primary) hypertension: Secondary | ICD-10-CM | POA: Insufficient documentation

## 2016-08-12 DIAGNOSIS — Z96643 Presence of artificial hip joint, bilateral: Secondary | ICD-10-CM | POA: Diagnosis not present

## 2016-08-12 DIAGNOSIS — L509 Urticaria, unspecified: Secondary | ICD-10-CM | POA: Diagnosis present

## 2016-08-12 MED ORDER — FAMOTIDINE 20 MG PO TABS
40.0000 mg | ORAL_TABLET | Freq: Once | ORAL | Status: AC
  Administered 2016-08-12: 40 mg via ORAL
  Filled 2016-08-12: qty 2

## 2016-08-12 MED ORDER — FAMOTIDINE 20 MG PO TABS: 20.0000 mg | ORAL_TABLET | Freq: Two times a day (BID) | ORAL | 0 refills | Status: AC

## 2016-08-12 MED ORDER — DIPHENHYDRAMINE HCL 25 MG PO CAPS
50.0000 mg | ORAL_CAPSULE | Freq: Once | ORAL | Status: AC
  Administered 2016-08-12: 50 mg via ORAL
  Filled 2016-08-12: qty 2

## 2016-08-12 NOTE — ED Notes (Signed)
Patient has raised red bumps on back. Patient has mild swelling to top lip. Patient denies any pain.

## 2016-08-12 NOTE — ED Notes (Signed)
MD at bedside. RN adjusted bed.

## 2016-08-12 NOTE — ED Triage Notes (Signed)
Per pt yesterday he had hives to his back. Hives are currently not present. Pink areas not to back, flat not raised. Pt did take 400 mg of benadryl prior to bed. Pt also applied benadryl lotion. Pt states his lip had swole last night becoming numb.

## 2016-08-12 NOTE — ED Provider Notes (Signed)
[facility]-EMERGENCY DEPT Provider Note   CSN: [id number] Arrival date & time: 08/12/16  [id number]     History   Chief Complaint Chief Complaint  Patient presents with  . Urticaria    HPI Patrick Salas is a 50 y.o. male.  Patient states that he has had a rash today with itching on his chest and abdomen which has improved. He also complains of swelling to his lips   The history is provided by the patient.  Urticaria  This is a new problem. The current episode started 12 to 24 hours ago. The problem occurs rarely. The problem has been resolved. Pertinent negatives include no chest pain, no abdominal pain and no headaches. Nothing aggravates the symptoms. Nothing relieves the symptoms.    Past Medical History:  Diagnosis Date  . Complication of anesthesia 2008   woke up during surgery- saw lights , did not hear anything.."maybe 2 sceonds".  . Family history of anesthesia complication    "whole family nausea"  . Hemorrhoids   . History of kidney stones   . Hypertension    takes Amlodipine and Losartan daily  . PONV (postoperative nausea and vomiting)     Patient Active Problem List   Diagnosis Date Noted  . Hip pain due to recalled hip arthroplasty hardware (HCC) 05/03/2014  . 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 Yeast infection of the perianal skin 03/01/2013  . Internal and external bleeding hemorrhoids 12/13/2012    Past Surgical History:  Procedure Laterality Date  . COLONOSCOPY    . JOINT REPLACEMENT Bilateral 2008/2009   2008- left, 2009- right  . left hip arthroscopy  2007  . LITHOTRIPSY  2002  . TOTAL HIP REVISION Left 11/16/2013   Procedure: TOTAL HIP REVISION;  Surgeon: Nestor LewandowskyFrank J Rowan, MD;  Location: [facility] OR;  Service: Orthopedics;  Laterality: Left;  . TOTAL HIP REVISION Right 05/03/2014   Procedure: TOTAL HIP REVISION;  Surgeon: Nestor LewandowskyFrank J Rowan, MD;  Location: [facility] OR;  Service: Orthopedics;  Laterality: Right;  . vascular fibular  graft Bilateral 2003  . WISDOM TOOTH EXTRACTION  at age 50       Home Medications    Prior to Admission medications   Medication Sig Start Date End Date Taking? Authorizing Provider  amLODipine (NORVASC) 10 MG tablet Take 5 mg by mouth daily.  12/07/12  Yes Historical Provider, MD  losartan (COZAAR) 100 MG tablet Take 50 mg by mouth daily.  12/07/12  Yes Historical Provider, MD  aspirin EC 325 MG tablet Take 1 tablet (325 mg total) by mouth 2 (two) times daily. Patient not taking: Reported on 08/12/2016 05/03/14   Allena KatzEric K Phillips, PA-C  famotidine (PEPCID) 20 MG tablet Take 1 tablet (20 mg total) by mouth 2 (two) times daily. 08/12/16   Bethann BerkshireJoseph Ramses Klecka, MD  methocarbamol (ROBAXIN) 500 MG tablet Take 1 tablet (500 mg total) by mouth 2 (two) times daily with a meal. Patient not taking: Reported on 08/12/2016 05/03/14   Allena KatzEric K Phillips, PA-C  oxyCODONE-acetaminophen (ROXICET) 5-325 MG per tablet Take 1 tablet by mouth every 4 (four) hours as needed. Patient not taking: Reported on 08/12/2016 05/03/14   Allena KatzEric K Phillips, PA-C    Family History Family History  Problem Relation Age of Onset  . Hypertension Mother   . Cancer Mother   . Glaucoma Mother     Social History Social History  Substance Use Topics  . Smoking status: Never Smoker  . Smokeless tobacco:  Never Used  . Alcohol use No     Allergies   Other; Prednisone; and Hydromorphone hcl   Review of Systems Review of Systems  Constitutional: Negative for appetite change and fatigue.  HENT: Negative for congestion, ear discharge and sinus pressure.        Form lips  Eyes: Negative for discharge.  Respiratory: Negative for cough.   Cardiovascular: Negative for chest pain.  Gastrointestinal: Negative for abdominal pain and diarrhea.  Genitourinary: Negative for frequency and hematuria.  Musculoskeletal: Negative for back pain.  Skin: Positive for rash.  Neurological: Negative for seizures and headaches.    Psychiatric/Behavioral: Negative for hallucinations.     Physical Exam Updated Vital Signs BP 139/97 (BP Location: Right Arm)   Pulse 102   Temp 97.6 F (36.4 C) (Oral)   Resp 18   Ht 5\' 7"  (1.702 m)   Wt 200 lb (90.7 kg)   SpO2 96%   BMI 31.32 kg/m   Physical Exam  Constitutional: He is oriented to person, place, and time. He appears well-developed.  HENT:  Head: Normocephalic.  Both lips mildly swollen tongue normal  Eyes: Conjunctivae and EOM are normal. No scleral icterus.  Neck: Neck supple. No thyromegaly present.  Cardiovascular: Normal rate and regular rhythm.  Exam reveals no gallop and no friction rub.   No murmur heard. Pulmonary/Chest: No stridor. He has no wheezes. He has no rales. He exhibits no tenderness.  Abdominal: He exhibits no distension. There is no tenderness. There is no rebound.  Musculoskeletal: Normal range of motion. He exhibits no edema.  Lymphadenopathy:    He has no cervical adenopathy.  Neurological: He is oriented to person, place, and time. He exhibits normal muscle tone. Coordination normal.  Skin: No rash noted. There is erythema.  Few areas of rash to his back nonpruritic now  Psychiatric: He has a normal mood and affect. His behavior is normal.     ED Treatments / Results  Labs (all labs ordered are listed, but only abnormal results are displayed) Labs Reviewed - No data to display  EKG  EKG Interpretation None       Radiology No results found.  Procedures Procedures (including critical care time)  Medications Ordered in ED Medications  diphenhydrAMINE (BENADRYL) capsule 50 mg (50 mg Oral Given 08/12/16 0728)  famotidine (PEPCID) tablet 40 mg (40 mg Oral Given 08/12/16 [id number])     Initial Impression / Assessment and Plan / ED Course  I have reviewed the triage vital signs and the nursing notes.  Pertinent labs & imaging results that were available during my care of the patient were reviewed by me and considered in  my medical decision making (see chart for details).  Clinical Course     Patient with allergic reaction. Patient is on losartan suspect the lip swelling may be related to the losartan. He will stop this blood pressure medicine he will be put on Pepcid and Benadryl. 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     Bethann BerkshireJoseph Riki Gehring, MD 08/12/16 [phone]

## 2016-08-12 NOTE — Discharge Instructions (Signed)
Take Benadryl 25 mg 4 times a day for rash itching or swelling. Return if symptoms get worse. Do not take your losartan blood pressure medicine anymore. Follow-up with your family doctor next week

## 2016-11-04 DIAGNOSIS — I10 Essential (primary) hypertension: Secondary | ICD-10-CM | POA: Diagnosis not present

## 2016-11-04 DIAGNOSIS — J101 Influenza due to other identified influenza virus with other respiratory manifestations: Secondary | ICD-10-CM | POA: Diagnosis not present

## 2016-11-04 DIAGNOSIS — K219 Gastro-esophageal reflux disease without esophagitis: Secondary | ICD-10-CM | POA: Diagnosis not present

## 2016-11-10 DIAGNOSIS — R21 Rash and other nonspecific skin eruption: Secondary | ICD-10-CM | POA: Diagnosis not present

## 2016-11-10 DIAGNOSIS — N179 Acute kidney failure, unspecified: Secondary | ICD-10-CM | POA: Diagnosis not present

## 2016-11-10 DIAGNOSIS — I10 Essential (primary) hypertension: Secondary | ICD-10-CM | POA: Diagnosis not present

## 2016-11-10 DIAGNOSIS — J101 Influenza due to other identified influenza virus with other respiratory manifestations: Secondary | ICD-10-CM | POA: Diagnosis not present

## 2016-11-10 DIAGNOSIS — L509 Urticaria, unspecified: Secondary | ICD-10-CM | POA: Diagnosis not present

## 2016-11-24 DIAGNOSIS — N183 Chronic kidney disease, stage 3 (moderate): Secondary | ICD-10-CM | POA: Diagnosis not present

## 2016-11-24 DIAGNOSIS — N179 Acute kidney failure, unspecified: Secondary | ICD-10-CM | POA: Diagnosis not present

## 2016-11-24 IMAGING — US US RENAL
1 series · 14 of 25 positions shown · non-contrast
Comparison: None.

CLINICAL DATA: Chronic stage 3 kidney disease.

EXAM:
RENAL/URINARY TRACT ULTRASOUND COMPLETE

[Series 1: us renal · 0.20mm/px · 14 of 36 slices shown]
[im 1/36]
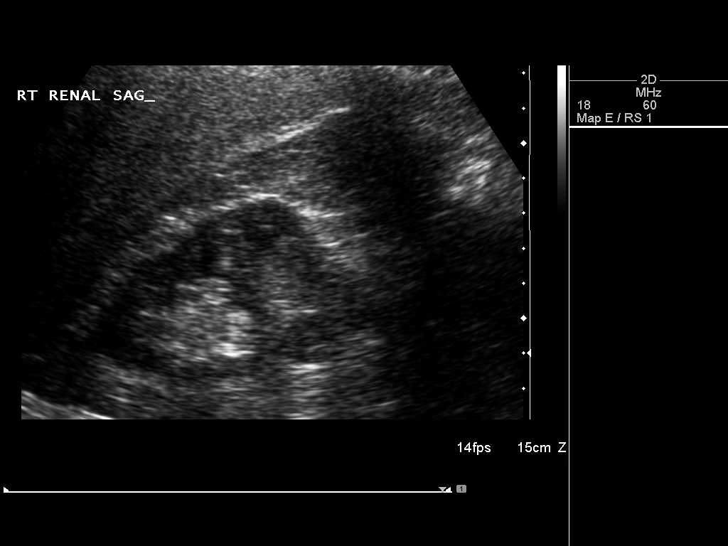
[im 3/36]
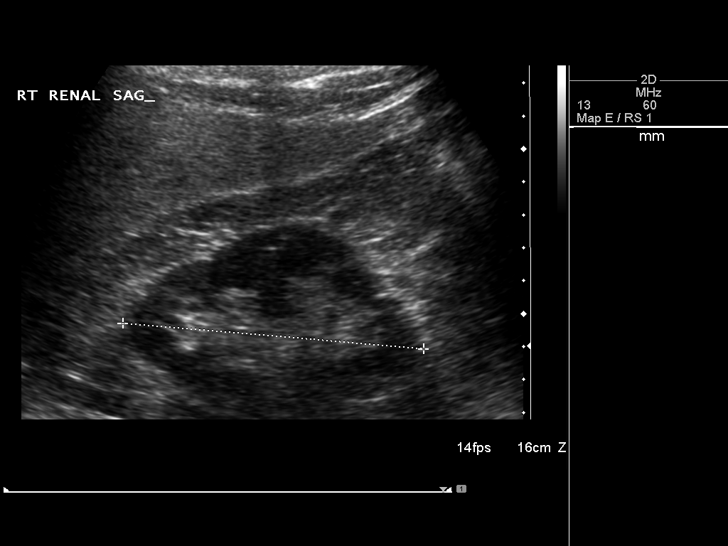
[im 6/36]
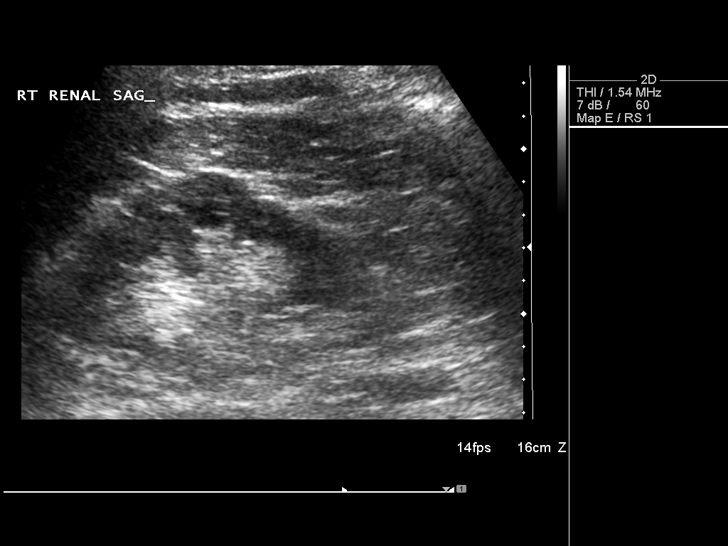
[im 9/36]
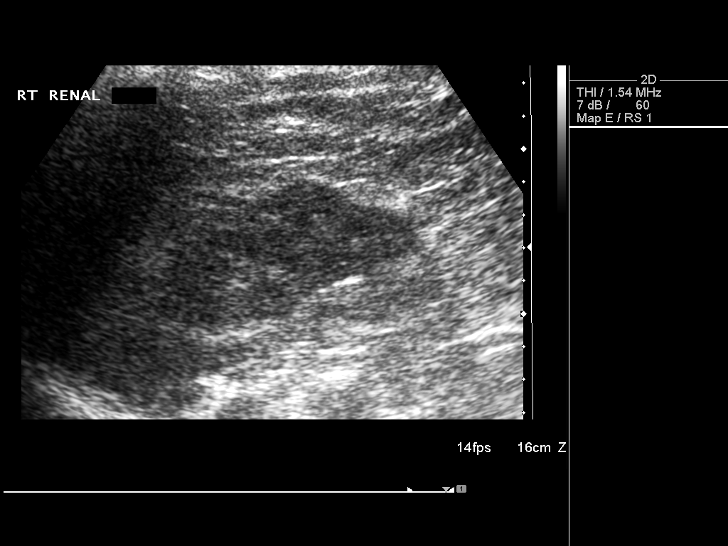
[im 12/36]
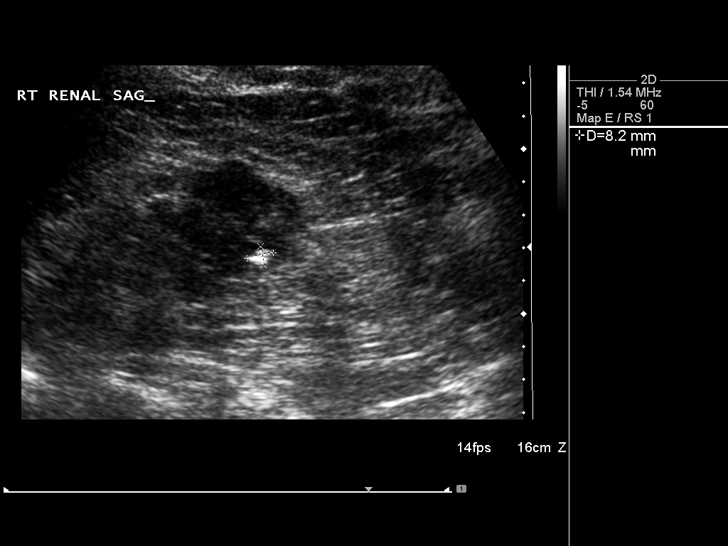
[im 14/36]
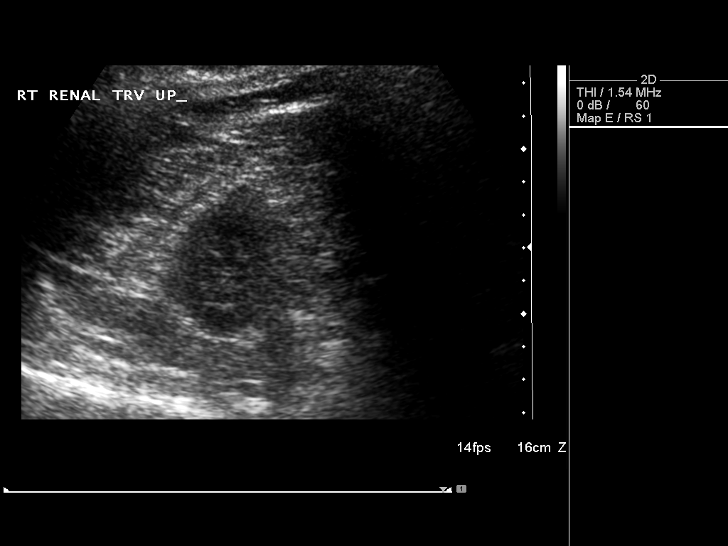
[im 17/36]
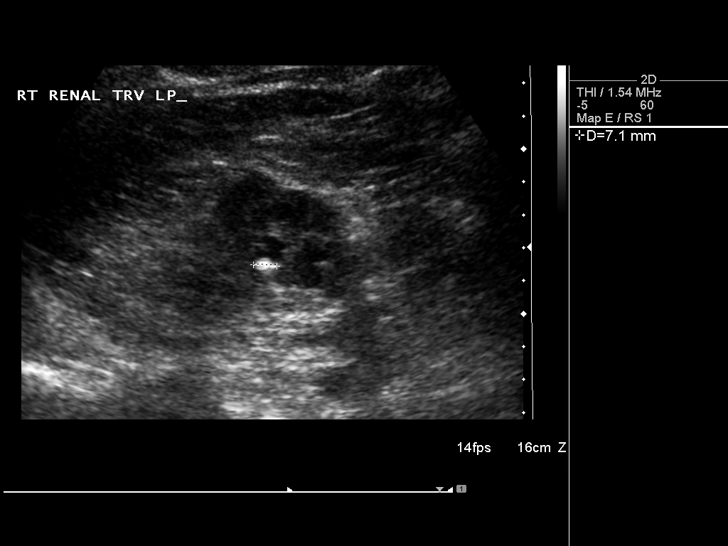
[im 19/36]
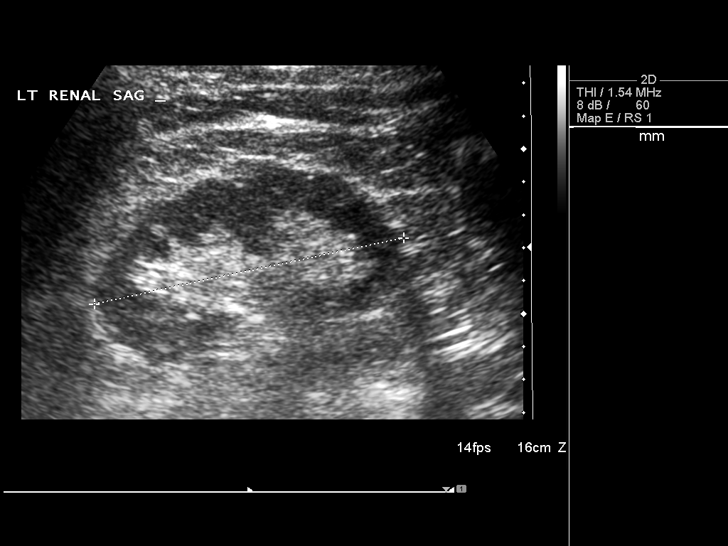
[im 22/36]
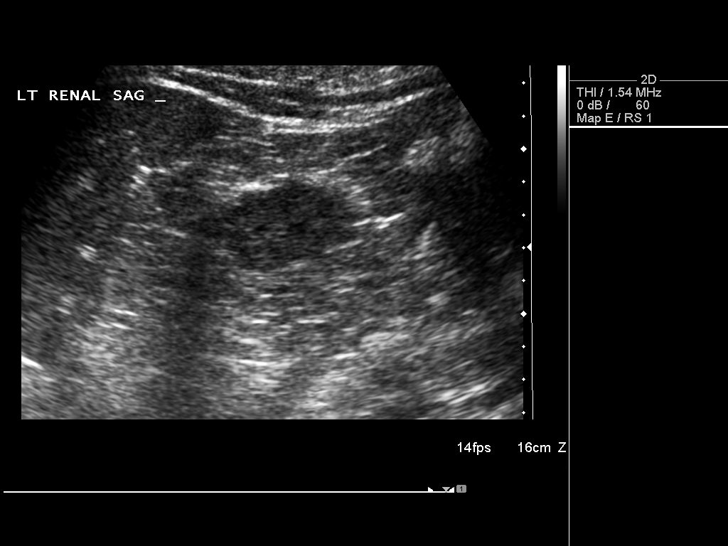
[im 24/36]
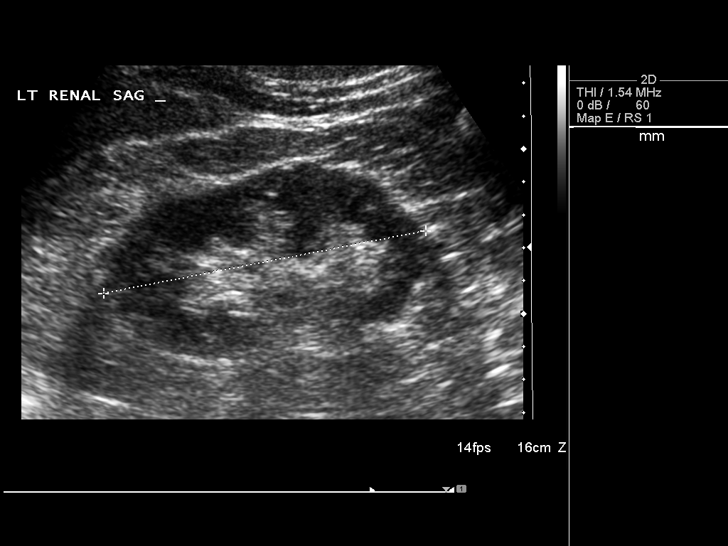
[im 27/36]
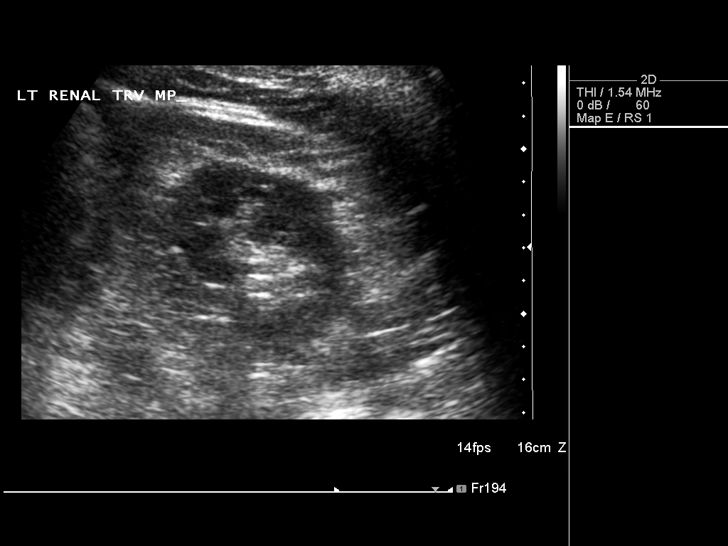
[im 30/36]
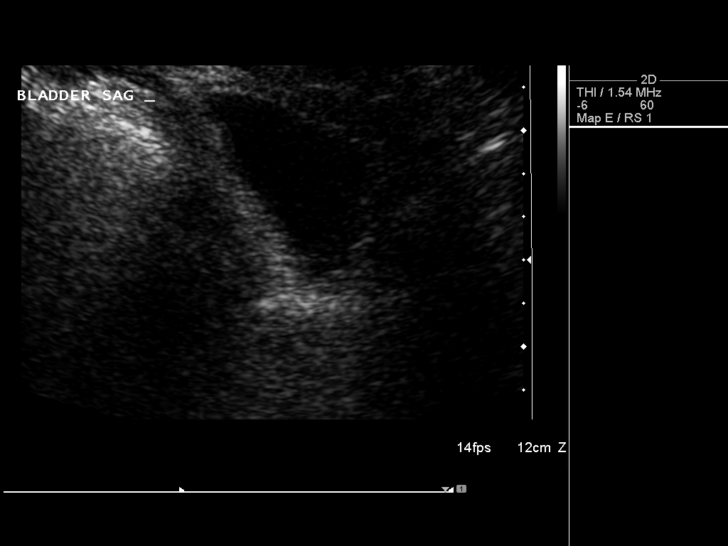
[im 33/36]
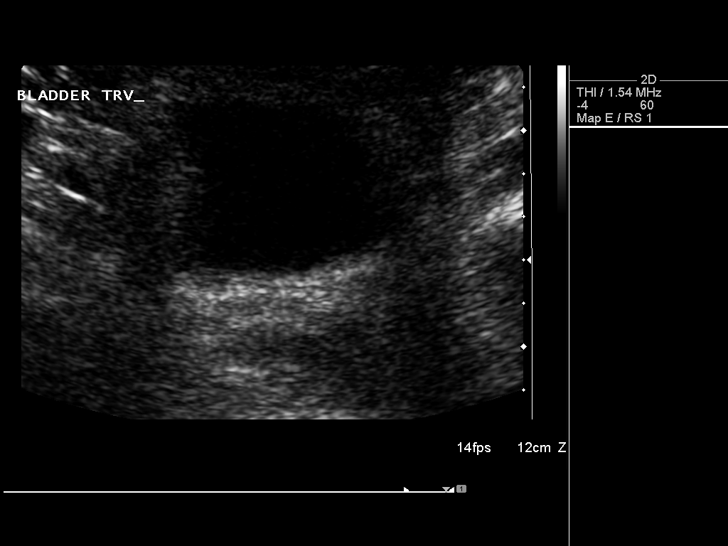
[im 36/36]
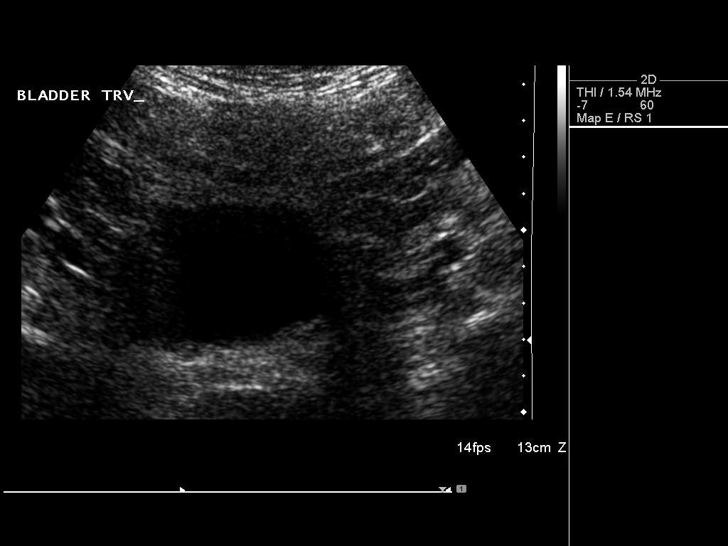

[14 of 25 positions shown; findings below may reference images not displayed]

FINDINGS: Right Kidney:

Length: 9.4 cm. Normal echogenicity and renal cortical thickness. No
hydronephrosis. An 8 mm lower pole renal calculus is noted.

Left Kidney:

Length: 10.0 cm. Normal renal cortical thickness and echogenicity
without focal lesions or hydronephrosis.

Bladder:

Appears normal for degree of bladder distention.
IMPRESSION: Lower pole right renal calculus.

Normal renal cortical thickness and echogenicity without
hydronephrosis.

## 2017-02-25 DIAGNOSIS — M25551 Pain in right hip: Secondary | ICD-10-CM | POA: Diagnosis not present

## 2017-02-25 DIAGNOSIS — M25552 Pain in left hip: Secondary | ICD-10-CM | POA: Diagnosis not present

## 2017-05-05 DIAGNOSIS — E782 Mixed hyperlipidemia: Secondary | ICD-10-CM | POA: Diagnosis not present

## 2017-05-05 DIAGNOSIS — I129 Hypertensive chronic kidney disease with stage 1 through stage 4 chronic kidney disease, or unspecified chronic kidney disease: Secondary | ICD-10-CM | POA: Diagnosis not present

## 2017-05-05 DIAGNOSIS — Z0000 Encounter for general adult medical examination without abnormal findings: Secondary | ICD-10-CM | POA: Diagnosis not present

## 2017-05-05 DIAGNOSIS — R05 Cough: Secondary | ICD-10-CM | POA: Diagnosis not present

## 2017-05-05 DIAGNOSIS — Z1389 Encounter for screening for other disorder: Secondary | ICD-10-CM | POA: Diagnosis not present

## 2017-05-05 DIAGNOSIS — N183 Chronic kidney disease, stage 3 (moderate): Secondary | ICD-10-CM | POA: Diagnosis not present

## 2017-05-05 DIAGNOSIS — I10 Essential (primary) hypertension: Secondary | ICD-10-CM | POA: Diagnosis not present

## 2017-11-09 DIAGNOSIS — I129 Hypertensive chronic kidney disease with stage 1 through stage 4 chronic kidney disease, or unspecified chronic kidney disease: Secondary | ICD-10-CM | POA: Diagnosis not present

## 2018-05-13 DIAGNOSIS — E782 Mixed hyperlipidemia: Secondary | ICD-10-CM | POA: Diagnosis not present

## 2018-05-13 DIAGNOSIS — N183 Chronic kidney disease, stage 3 (moderate): Secondary | ICD-10-CM | POA: Diagnosis not present

## 2018-05-13 DIAGNOSIS — Z1389 Encounter for screening for other disorder: Secondary | ICD-10-CM | POA: Diagnosis not present

## 2018-05-13 DIAGNOSIS — Z6832 Body mass index (BMI) 32.0-32.9, adult: Secondary | ICD-10-CM | POA: Diagnosis not present

## 2018-05-13 DIAGNOSIS — E669 Obesity, unspecified: Secondary | ICD-10-CM | POA: Diagnosis not present

## 2018-05-13 DIAGNOSIS — Z0000 Encounter for general adult medical examination without abnormal findings: Secondary | ICD-10-CM | POA: Diagnosis not present

## 2018-05-13 DIAGNOSIS — I129 Hypertensive chronic kidney disease with stage 1 through stage 4 chronic kidney disease, or unspecified chronic kidney disease: Secondary | ICD-10-CM | POA: Diagnosis not present

## 2018-09-02 DIAGNOSIS — Z96642 Presence of left artificial hip joint: Secondary | ICD-10-CM | POA: Diagnosis not present

## 2018-09-02 DIAGNOSIS — M545 Low back pain: Secondary | ICD-10-CM | POA: Diagnosis not present

## 2018-09-02 DIAGNOSIS — Z96641 Presence of right artificial hip joint: Secondary | ICD-10-CM | POA: Diagnosis not present

## 2018-10-28 ENCOUNTER — Encounter: Payer: Self-pay | Admitting: Family Medicine

## 2018-10-28 ENCOUNTER — Ambulatory Visit (INDEPENDENT_AMBULATORY_CARE_PROVIDER_SITE_OTHER): Payer: Self-pay | Admitting: Family Medicine

## 2018-10-28 VITALS — BP 108/66 | HR 76 | Temp 98.3°F | Resp 24

## 2018-10-28 DIAGNOSIS — J209 Acute bronchitis, unspecified: Secondary | ICD-10-CM

## 2018-10-28 DIAGNOSIS — J101 Influenza due to other identified influenza virus with other respiratory manifestations: Secondary | ICD-10-CM

## 2018-10-28 DIAGNOSIS — E860 Dehydration: Secondary | ICD-10-CM

## 2018-10-28 MED ORDER — ALBUTEROL SULFATE HFA 108 (90 BASE) MCG/ACT IN AERS: 2.0000 | INHALATION_SPRAY | Freq: Four times a day (QID) | RESPIRATORY_TRACT | 0 refills | Status: AC | PRN

## 2018-10-28 MED ORDER — PROMETHAZINE HCL 25 MG PO TABS: 25.0000 mg | ORAL_TABLET | Freq: Three times a day (TID) | ORAL | 0 refills | Status: AC | PRN

## 2018-10-28 MED ORDER — AZITHROMYCIN 250 MG PO TABS
ORAL_TABLET | ORAL | 0 refills | Status: AC
Start: 2018-10-28 — End: ?

## 2018-10-28 NOTE — Progress Notes (Signed)
Subjective:    Patient ID: Patrick Salas, male    DOB: [birth year], 53 y.o.   MRN: [id number]  HPI  Patient is here today with his daughter is my patient.  She has recently been diagnosed with influenza B.  Patient sees a different PCP however he is ill-appearing and therefore I worked him into my schedule.  He reports a one-week history of fevers, weakness, diffuse body aches, cough, central chest pleurisy and some mild sob.  He also reports feeling extremely lightheaded.  He reports feeling nauseated.  He had to lie down during our encounter due to feeling lightheaded as though he may pass out.  Temperature today is 98.3 however the patient is extremely diaphoretic and weak appearing.  Blood pressure was initially too soft to hear.  During this week, because of nausea, he has not been eating or drinking very much.  However he has also been taking his blood pressure medication, amlodipine and losartan. Past Medical History:  Diagnosis Date  . Complication of anesthesia 2008   woke up during surgery- saw lights , did not hear anything.."maybe 2 sceonds".  . Family history of anesthesia complication    "whole family nausea"  . Hemorrhoids   . History of kidney stones   . Hypertension    takes Amlodipine and Losartan daily  . PONV (postoperative nausea and vomiting)    Past Surgical History:  Procedure Laterality Date  . COLONOSCOPY    . JOINT REPLACEMENT Bilateral 2008/2009   2008- left, 2009- right  . left hip arthroscopy  2007  . LITHOTRIPSY  2002  . TOTAL HIP REVISION Left 11/16/2013   Procedure: TOTAL HIP REVISION;  Surgeon: Nestor Lewandowsky, MD;  Location: [facility] OR;  Service: Orthopedics;  Laterality: Left;  . TOTAL HIP REVISION Right 05/03/2014   Procedure: TOTAL HIP REVISION;  Surgeon: Nestor Lewandowsky, MD;  Location: [facility] OR;  Service: Orthopedics;  Laterality: Right;  . vascular fibular graft Bilateral 2003  . WISDOM TOOTH EXTRACTION  at age 56   Current Outpatient Medications on  File Prior to Visit  Medication Sig Dispense Refill  . amLODipine (NORVASC) 10 MG tablet Take 5 mg by mouth daily.     Marland Kitchen aspirin EC 325 MG tablet Take 1 tablet (325 mg total) by mouth 2 (two) times daily. (Patient not taking: Reported on 08/12/2016) 30 tablet 0  . famotidine (PEPCID) 20 MG tablet Take 1 tablet (20 mg total) by mouth 2 (two) times daily. 10 tablet 0  . losartan (COZAAR) 100 MG tablet Take 50 mg by mouth daily.     . methocarbamol (ROBAXIN) 500 MG tablet Take 1 tablet (500 mg total) by mouth 2 (two) times daily with a meal. (Patient not taking: Reported on 08/12/2016) 60 tablet 0  . oxyCODONE-acetaminophen (ROXICET) 5-325 MG per tablet Take 1 tablet by mouth every 4 (four) hours as needed. (Patient not taking: Reported on 08/12/2016) 60 tablet 0   No current facility-administered medications on file prior to visit.    Allergies  Allergen Reactions  . Other Other (See Comments)    Do not give patient any steroids  . Prednisone Other (See Comments)    Lost blood flow to legs  . Hydromorphone Hcl Hives    Shortly after administration, patient had hives and itchy rash near insertion site   Social History   Socioeconomic History  . Marital status: Single    Spouse name: Not on file  . Number of children: Not on  file  . Years of education: Not on file  . Highest education level: Not on file  Occupational History  . Not on file  Social Needs  . Financial resource strain: Not on file  . Food insecurity:    Worry: Not on file    Inability: Not on file  . Transportation needs:    Medical: Not on file    Non-medical: Not on file  Tobacco Use  . Smoking status: Never Smoker  . Smokeless tobacco: Never Used  Substance and Sexual Activity  . Alcohol use: No  . Drug use: No  . Sexual activity: Not on file  Lifestyle  . Physical activity:    Days per week: Not on file    Minutes per session: Not on file  . Stress: Not on file  Relationships  . Social connections:     Talks on phone: Not on file    Gets together: Not on file    Attends religious service: Not on file    Active member of club or organization: Not on file    Attends meetings of clubs or organizations: Not on file    Relationship status: Not on file  . Intimate partner violence:    Fear of current or ex partner: Not on file    Emotionally abused: Not on file    Physically abused: Not on file    Forced sexual activity: Not on file  Other Topics Concern  . Not on file  Social History Narrative  . Not on file     Review of Systems     Objective:   Physical Exam Constitutional:      Appearance: He is ill-appearing and diaphoretic.  HENT:     Right Ear: Tympanic membrane and ear canal normal.     Left Ear: Tympanic membrane and ear canal normal.     Nose: Rhinorrhea present. No congestion.     Mouth/Throat:     Mouth: Mucous membranes are dry.     Pharynx: No oropharyngeal exudate or posterior oropharyngeal erythema.  Eyes:     General:        Right eye: No discharge.        Left eye: No discharge.     Conjunctiva/sclera: Conjunctivae normal.  Cardiovascular:     Rate and Rhythm: Normal rate.     Pulses: Normal pulses.     Heart sounds: Normal heart sounds.  Pulmonary:     Breath sounds: Wheezing and rhonchi present.  Abdominal:     General: Bowel sounds are normal.     Palpations: Abdomen is soft.  Lymphadenopathy:     Cervical: No cervical adenopathy.  Neurological:     Mental Status: He is alert.           Assessment & Plan:  Dehydration  Bronchitis with bronchospasm  Influenza B  Patient was diaphoretic.  He appeared dehydrated.  His blood pressure was so low it was difficult to auscultate.  Therefore the patient received a 1 L bolus of normal saline.  After receiving 1 L, the patient's blood pressure rose to 108/66 upon.  Patient received an additional 500 cc of normal saline and his blood pressure remained stable.  He started to feel much better after  his blood pressure was increased.  I recommended discontinuation of amlodipine and losartan until he starts to feel better.  At the present time he does not need additional antihypertensive medication while he is dehydrated and hypotensive.  I recommended  that he push fluids such as Gatorade and eat a bland diet including chicken noodle soup, etc.  He can use Phenergan 25 mg every 6 hours as needed for nausea.  Patient has profound wheezing and rhonchorous breath sounds bilaterally suggesting bronchitis.  I suspect he has viral influenza given his daughter's exposure however I will treat the patient with albuterol 2 puffs inhaled every 4-6 hours for wheezing.  He can use a Z-Pak for bronchitis in case this is bacterial.  Reassess in 48 hours if no better or sooner if worse

## 2018-11-15 DIAGNOSIS — N183 Chronic kidney disease, stage 3 (moderate): Secondary | ICD-10-CM | POA: Diagnosis not present

## 2018-11-15 DIAGNOSIS — I129 Hypertensive chronic kidney disease with stage 1 through stage 4 chronic kidney disease, or unspecified chronic kidney disease: Secondary | ICD-10-CM | POA: Diagnosis not present

## 2019-05-20 DIAGNOSIS — Z1389 Encounter for screening for other disorder: Secondary | ICD-10-CM | POA: Diagnosis not present

## 2019-05-20 DIAGNOSIS — E782 Mixed hyperlipidemia: Secondary | ICD-10-CM | POA: Diagnosis not present

## 2019-05-20 DIAGNOSIS — I129 Hypertensive chronic kidney disease with stage 1 through stage 4 chronic kidney disease, or unspecified chronic kidney disease: Secondary | ICD-10-CM | POA: Diagnosis not present

## 2019-05-20 DIAGNOSIS — Z0000 Encounter for general adult medical examination without abnormal findings: Secondary | ICD-10-CM | POA: Diagnosis not present

## 2019-05-20 DIAGNOSIS — N183 Chronic kidney disease, stage 3 (moderate): Secondary | ICD-10-CM | POA: Diagnosis not present

## 2019-12-12 DIAGNOSIS — I10 Essential (primary) hypertension: Secondary | ICD-10-CM | POA: Diagnosis not present

## 2020-01-17 DIAGNOSIS — M13179 Monoarthritis, not elsewhere classified, unspecified ankle and foot: Secondary | ICD-10-CM | POA: Diagnosis not present

## 2020-01-17 DIAGNOSIS — I129 Hypertensive chronic kidney disease with stage 1 through stage 4 chronic kidney disease, or unspecified chronic kidney disease: Secondary | ICD-10-CM | POA: Diagnosis not present

## 2020-01-17 DIAGNOSIS — E782 Mixed hyperlipidemia: Secondary | ICD-10-CM | POA: Diagnosis not present
